# Patient Record
Sex: Female | Born: 1990 | Race: Black or African American | Hispanic: No | Marital: Single | Smoking: Former smoker
Health system: Southern US, Community
[De-identification: ages and names within clinical notes are randomized; demographics above are authoritative.]

## PROBLEM LIST (undated history)

## (undated) DIAGNOSIS — T7840XA Allergy, unspecified, initial encounter: Secondary | ICD-10-CM

## (undated) DIAGNOSIS — Z789 Other specified health status: Secondary | ICD-10-CM

## (undated) HISTORY — DX: Other specified health status: Z78.9

## (undated) HISTORY — DX: Allergy, unspecified, initial encounter: T78.40XA

## (undated) HISTORY — PX: MANDIBLE SURGERY: SHX707

---
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
## 2010-01-12 ENCOUNTER — Emergency Department (HOSPITAL_COMMUNITY): Admission: EM | Admit: 2010-01-12 | Discharge: 2010-01-12 | Payer: Self-pay | Admitting: Emergency Medicine

## 2010-06-04 ENCOUNTER — Emergency Department (HOSPITAL_COMMUNITY)
Admission: EM | Admit: 2010-06-04 | Discharge: 2010-06-04 | Payer: Self-pay | Source: Home / Self Care | Admitting: Emergency Medicine

## 2010-07-13 ENCOUNTER — Encounter: Payer: Self-pay | Admitting: Obstetrics and Gynecology

## 2010-08-13 ENCOUNTER — Other Ambulatory Visit: Payer: Self-pay | Admitting: Family Medicine

## 2010-08-13 DIAGNOSIS — Z3689 Encounter for other specified antenatal screening: Secondary | ICD-10-CM

## 2010-08-13 DIAGNOSIS — O26849 Uterine size-date discrepancy, unspecified trimester: Secondary | ICD-10-CM

## 2010-08-13 LAB — URINALYSIS, ROUTINE W REFLEX MICROSCOPIC
Bilirubin Urine: NEGATIVE
Glucose, UA: NEGATIVE mg/dL
Hgb urine dipstick: NEGATIVE
Ketones, ur: NEGATIVE mg/dL
Protein, ur: NEGATIVE mg/dL
pH: 7 (ref 5.0–8.0)

## 2010-08-14 ENCOUNTER — Ambulatory Visit (HOSPITAL_COMMUNITY)
Admission: RE | Admit: 2010-08-14 | Discharge: 2010-08-14 | Disposition: A | Discharge status: Home / Self Care | Payer: Self-pay | Source: Ambulatory Visit | Attending: Family Medicine | Admitting: Family Medicine

## 2010-08-14 DIAGNOSIS — O3660X0 Maternal care for excessive fetal growth, unspecified trimester, not applicable or unspecified: Secondary | ICD-10-CM | POA: Insufficient documentation

## 2010-08-14 DIAGNOSIS — O358XX0 Maternal care for other (suspected) fetal abnormality and damage, not applicable or unspecified: Secondary | ICD-10-CM | POA: Insufficient documentation

## 2010-08-14 DIAGNOSIS — Z3689 Encounter for other specified antenatal screening: Secondary | ICD-10-CM

## 2010-08-14 DIAGNOSIS — O26849 Uterine size-date discrepancy, unspecified trimester: Secondary | ICD-10-CM

## 2010-08-14 DIAGNOSIS — Z363 Encounter for antenatal screening for malformations: Secondary | ICD-10-CM | POA: Insufficient documentation

## 2010-08-14 DIAGNOSIS — Z1389 Encounter for screening for other disorder: Secondary | ICD-10-CM | POA: Insufficient documentation

## 2010-08-17 LAB — URINALYSIS, ROUTINE W REFLEX MICROSCOPIC
Glucose, UA: NEGATIVE mg/dL
Hgb urine dipstick: NEGATIVE
Specific Gravity, Urine: 1.008 (ref 1.005–1.030)
Urobilinogen, UA: 0.2 mg/dL (ref 0.0–1.0)
pH: 7.5 (ref 5.0–8.0)

## 2010-08-17 LAB — GC/CHLAMYDIA PROBE AMP, GENITAL: GC Probe Amp, Genital: NEGATIVE

## 2010-08-17 LAB — POCT PREGNANCY, URINE: Preg Test, Ur: NEGATIVE

## 2010-08-17 LAB — WET PREP, GENITAL: Clue Cells Wet Prep HPF POC: NONE SEEN

## 2010-10-01 ENCOUNTER — Inpatient Hospital Stay (HOSPITAL_COMMUNITY)
Admission: AD | Admit: 2010-10-01 | Discharge: 2010-10-04 | DRG: 775 | Disposition: A | Discharge status: Home / Self Care | Payer: Medicaid Other | Source: Ambulatory Visit | Attending: Obstetrics & Gynecology | Admitting: Obstetrics & Gynecology

## 2010-10-01 DIAGNOSIS — O411090 Infection of amniotic sac and membranes, unspecified, unspecified trimester, not applicable or unspecified: Secondary | ICD-10-CM | POA: Diagnosis present

## 2010-10-01 LAB — CBC
Hemoglobin: 11.9 g/dL — ABNORMAL LOW (ref 12.0–15.0)
MCH: 31.1 pg (ref 26.0–34.0)
RBC: 3.83 MIL/uL — ABNORMAL LOW (ref 3.87–5.11)
WBC: 12.9 10*3/uL — ABNORMAL HIGH (ref 4.0–10.5)

## 2010-10-02 DIAGNOSIS — O411090 Infection of amniotic sac and membranes, unspecified, unspecified trimester, not applicable or unspecified: Secondary | ICD-10-CM

## 2010-10-02 LAB — ABO/RH: ABO/RH(D): O POS

## 2010-10-02 LAB — RPR: RPR Ser Ql: NONREACTIVE

## 2010-11-05 ENCOUNTER — Encounter: Payer: Self-pay | Admitting: Medical

## 2010-11-05 ENCOUNTER — Ambulatory Visit (INDEPENDENT_AMBULATORY_CARE_PROVIDER_SITE_OTHER): Payer: Medicaid Other | Admitting: Medical

## 2010-11-05 VITALS — BP 102/72 | HR 68 | Ht 59.0 in | Wt 106.0 lb

## 2010-11-05 DIAGNOSIS — L03019 Cellulitis of unspecified finger: Secondary | ICD-10-CM

## 2010-11-05 DIAGNOSIS — IMO0001 Reserved for inherently not codable concepts without codable children: Secondary | ICD-10-CM

## 2010-11-05 MED ORDER — DOXYCYCLINE HYCLATE 100 MG PO TABS: 100.0000 mg | ORAL_TABLET | Freq: Two times a day (BID) | ORAL | Status: AC

## 2010-11-05 NOTE — Progress Notes (Signed)
Subjective:     Catherine Miles is a 20 y.o. female who presents for evaluation of a probable cutaneous infection or right middle finger.  Lesion is located along the nailbed of the right middle finger. Onset was 7 days ago. Symptoms have gradually worsened.  Abscess has associated symptoms of none. Patient does not have previous history of cutaneous abscesses. Patient does not have diabetes.  She does have a new 57mo baby but is not breastfeeding.  No other c/o.   The following portions of the patient's history were reviewed and updated as appropriate: allergies, current medications, past family history, past medical history, past social history, past surgical history and problem list.    Objective:    There is an area characterized by induration, fluctuance, tenderness along the right 3rd finger nailbed.    Procedure Informed consent obtained.  The area was prepped in the usual manner and the skin overlying the abscess was anesthetized with ethyl acetate spray and area was sharply incised with #11 blade and approx 1.5 ccs of purulent material was obtained.   Wound was covered with sterile bandage.      Assessment:   Encounter Diagnosis  Name Primary?  . Paronychia of third finger of right hand Yes     Plan:    Apply hot compresses frequently to promote drainage. Reassured that this represents a benign process. Oral antibiotics -- see med orders. I & D procedure as above. RTC PRN.

## 2010-11-05 NOTE — Patient Instructions (Signed)
Paronychia (Felon, Whitlow) Paronychia is an inflammatory reaction involving the folds of the skin surrounding the fingernail. This is commonly caused by an infection in the skin around a nail. The most common cause of paronychia is frequent wetting of the hands (as seen with bartenders, food servers, nurses or others who wet their hands). This makes the skin around the fingernail susceptible to infection by bacteria (germs) or fungus. Other predisposing factors are:  Aggressive manicuring.   Nail biting.   Thumbsucking.  The most common cause is a staphylococcal (a type of germ) infection, or a fungal (Candida) infection. When caused by a germ, it usually comes on suddenly with redness, swelling, pus and is often painful. It may get under the nail and form an abscess (collection of pus), or form an abscess around the nail. If the nail itself is infected with a fungus, the treatment is usually prolonged and may require oral medicine for up to one year. Your caregiver will determine the length of time treatment is required. The paronychia caused by bacteria (germs) may largely be avoided by not pulling on hangnails or picking at cuticles. When the infection occurs at the tips of the finger it is called felon. When the cause of paronychia is from the herpes simplex virus (HSV) it is called herpetic whitlow. TREATMENT  When an abscess is present treatment is often incision and drainage. This means that the abscess must be cut open so the pus can get out. When this is done, the following home care instructions should be followed.  HOME CARE INSTRUCTIONS  It is important to keep the affected fingers very dry. Rubber or plastic gloves over cotton gloves should be used whenever the hand must be placed in water.   Keep wound clean, dry and dressed as suggested by your caregiver between warm soaks or warm compresses.   Soak in warm water for fifteen to twenty minutes three to four times per day for  bacterial infections. Fungal infections are very difficult to treat, so often require treatment for long periods of time.   For bacterial (germ) infections take antibiotics (medicine which kill germs) as directed and finish the prescription, even if the problem appears to be solved before the medicine is gone.   Only take over-the-counter or prescription medicines for pain, discomfort, or fever as directed by your caregiver.  SEEK IMMEDIATE MEDICAL CARE IF :  There is redness, swelling, or increasing pain in the wound.   Pus is coming from wound.   An unexplained oral temperature above 101 develops.   You notice a foul smell coming from the wound or dressing.  Document Released: 11/13/2000 Document Re-Released: 08/16/2008 Exit[employer] Patient Information 2011 [employer]. 

## 2011-03-27 ENCOUNTER — Encounter: Payer: Self-pay | Admitting: Family Medicine

## 2012-10-24 ENCOUNTER — Emergency Department (HOSPITAL_COMMUNITY)
Admission: EM | Admit: 2012-10-24 | Discharge: 2012-10-24 | Disposition: A | Discharge status: Home / Self Care | Payer: Self-pay | Attending: Emergency Medicine | Admitting: Emergency Medicine

## 2012-10-24 ENCOUNTER — Encounter (HOSPITAL_COMMUNITY): Payer: Self-pay | Admitting: Family Medicine

## 2012-10-24 DIAGNOSIS — L989 Disorder of the skin and subcutaneous tissue, unspecified: Secondary | ICD-10-CM | POA: Insufficient documentation

## 2012-10-24 DIAGNOSIS — M7989 Other specified soft tissue disorders: Secondary | ICD-10-CM

## 2012-10-24 NOTE — ED Provider Notes (Signed)
History     CSN: [id number]  Arrival date & time 10/24/12  0801   First MD Initiated Contact with Patient 10/24/12 [phone]      Chief Complaint  Patient presents with  . Hand Problem    (Consider location/radiation/quality/duration/timing/severity/associated sxs/prior treatment) HPI Comments: Patient reports she was at work yesterday when her boss noticed an area of swelling over the dorsal aspect of her right hand. States she previously was not aware of it.  States after she noticed it and was pressing it it became sore, particularly proximal to the lesion.  Denies fevers, joint pain, weakness or numbness of the hand. Denies any known trauma to the hand. No known insect bites or exposures.    The history is provided by the patient.    Past Medical History  Diagnosis Date  . Allergy     Past Surgical History  Procedure Laterality Date  . Mandible surgery      History reviewed. No pertinent family history.  History  Substance Use Topics  . Smoking status: Never Smoker   . Smokeless tobacco: Never Used  . Alcohol Use: No    OB History   Grav Para Term Preterm Abortions TAB SAB Ect Mult Living                  Review of Systems  Constitutional: Negative for fever and chills.  Skin: Negative for color change and wound.  Neurological: Negative for weakness and numbness.    Allergies  Review of patient's allergies indicates no known allergies.  Home Medications   Current Outpatient Rx  Name  Route  Sig  Dispense  Refill  . medroxyPROGESTERone (DEPO-PROVERA) 150 MG/ML injection   Intramuscular   Inject 150 mg into the muscle every 3 (three) months.             BP 140/98  Pulse 97  Temp(Src) 97.5 F (36.4 C) (Oral)  Resp 20  SpO2 100%  Physical Exam  Nursing note and vitals reviewed. Constitutional: She appears well-developed and well-nourished. No distress.  HENT:  Head: Normocephalic and atraumatic.  Neck: Neck supple.  Pulmonary/Chest: Effort  normal.  Musculoskeletal:       Hands: Right hand/wrist: full AROM of all joints, strength 5/5, sensation intact, capillary refill < 2 seconds.   Neurological: She is alert.  Skin: She is not diaphoretic.    ED Course  Procedures (including critical care time)  Labs Reviewed - No data to display No results found.   1. Soft tissue mass     MDM  Very soft tissue mass over dorsum of right hand.  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  Not infectious appearing.  Discussed findings, likely diagnosis, follow up with patient.  Pt given return precautions.  Pt verbalizes understanding and agrees with plan.     I doubt any other EMC precluding discharge at this time including, but not necessarily limited to the following: abscess         Trixie Dredge, PA-C 10/24/12 [phone]

## 2012-10-24 NOTE — ED Notes (Addendum)
Pt has knot to right hand that is tender to touch.

## 2012-10-25 NOTE — ED Provider Notes (Signed)
Medical screening examination/treatment/procedure(s) were performed by non-physician practitioner and as supervising physician I was immediately available for consultation/collaboration.   Amando Chaput, MD 10/25/12 0708 

## 2013-11-11 ENCOUNTER — Ambulatory Visit (INDEPENDENT_AMBULATORY_CARE_PROVIDER_SITE_OTHER): Payer: Self-pay | Admitting: Family Medicine

## 2013-11-11 ENCOUNTER — Encounter: Payer: Self-pay | Admitting: Family Medicine

## 2013-11-11 VITALS — Wt 102.0 lb

## 2013-11-11 DIAGNOSIS — H109 Unspecified conjunctivitis: Secondary | ICD-10-CM

## 2013-11-11 MED ORDER — ERYTHROMYCIN 5 MG/GM OP OINT: 1.0000 "application " | TOPICAL_OINTMENT | Freq: Three times a day (TID) | OPHTHALMIC | Status: DC

## 2013-11-11 NOTE — Progress Notes (Signed)
   Subjective:    Patient ID: Catherine Miles, female    DOB: 28-Aug-1990, 23 y.o.   MRN: [id number]  HPI She is here for evaluation of a several day history of drainage and itching of both eyes. She's had some slight earache but no fever, chills, sneezing, nasal congestion. She was seen in an urgent care Center when she was just having itchy eyes and told use Benadryl. States that the drainage is green colored.   Review of Systems     Objective:   Physical Exam Both conjunctiva are injected with some slight clear drainage. TMs clear. No preauricular adenopathy noted. Neck is supple without adenopathy. Throat is clear.       Assessment & Plan:  Conjunctivitis - Plan: erythromycin ophthalmic ointment  Reassured her that I did not think she was in any danger. Recommend good handwashing.

## 2015-01-21 ENCOUNTER — Emergency Department (HOSPITAL_COMMUNITY)
Admission: EM | Admit: 2015-01-21 | Discharge: 2015-01-21 | Payer: Medicaid Other | Attending: Emergency Medicine | Admitting: Emergency Medicine

## 2015-01-21 ENCOUNTER — Inpatient Hospital Stay
Admission: RE | Admit: 2015-01-21 | Discharge: 2015-01-24 | DRG: 882 | Disposition: A | Discharge status: Home / Self Care | Payer: No Typology Code available for payment source | Source: Other Acute Inpatient Hospital | Attending: Psychiatry | Admitting: Psychiatry

## 2015-01-21 ENCOUNTER — Encounter: Payer: Self-pay | Admitting: Behavioral Health

## 2015-01-21 ENCOUNTER — Encounter (HOSPITAL_COMMUNITY): Payer: Self-pay | Admitting: Emergency Medicine

## 2015-01-21 DIAGNOSIS — F1290 Cannabis use, unspecified, uncomplicated: Secondary | ICD-10-CM | POA: Diagnosis present

## 2015-01-21 DIAGNOSIS — F4325 Adjustment disorder with mixed disturbance of emotions and conduct: Secondary | ICD-10-CM

## 2015-01-21 DIAGNOSIS — F1010 Alcohol abuse, uncomplicated: Secondary | ICD-10-CM | POA: Diagnosis present

## 2015-01-21 DIAGNOSIS — F17210 Nicotine dependence, cigarettes, uncomplicated: Secondary | ICD-10-CM | POA: Diagnosis present

## 2015-01-21 DIAGNOSIS — F1020 Alcohol dependence, uncomplicated: Secondary | ICD-10-CM

## 2015-01-21 DIAGNOSIS — F39 Unspecified mood [affective] disorder: Secondary | ICD-10-CM | POA: Diagnosis not present

## 2015-01-21 DIAGNOSIS — R45851 Suicidal ideations: Secondary | ICD-10-CM | POA: Diagnosis present

## 2015-01-21 DIAGNOSIS — G4700 Insomnia, unspecified: Secondary | ICD-10-CM | POA: Diagnosis present

## 2015-01-21 DIAGNOSIS — F419 Anxiety disorder, unspecified: Secondary | ICD-10-CM

## 2015-01-21 DIAGNOSIS — F17200 Nicotine dependence, unspecified, uncomplicated: Secondary | ICD-10-CM

## 2015-01-21 DIAGNOSIS — Z3202 Encounter for pregnancy test, result negative: Secondary | ICD-10-CM | POA: Insufficient documentation

## 2015-01-21 DIAGNOSIS — Z792 Long term (current) use of antibiotics: Secondary | ICD-10-CM | POA: Insufficient documentation

## 2015-01-21 DIAGNOSIS — F1220 Cannabis dependence, uncomplicated: Secondary | ICD-10-CM

## 2015-01-21 DIAGNOSIS — F322 Major depressive disorder, single episode, severe without psychotic features: Secondary | ICD-10-CM

## 2015-01-21 LAB — I-STAT CHEM 8, ED
BUN: 12 mg/dL (ref 6–20)
CREATININE: 0.9 mg/dL (ref 0.44–1.00)
Calcium, Ion: 1.11 mmol/L — ABNORMAL LOW (ref 1.12–1.23)
Chloride: 107 mmol/L (ref 101–111)
Glucose, Bld: 92 mg/dL (ref 65–99)
HEMATOCRIT: 43 % (ref 36.0–46.0)
HEMOGLOBIN: 14.6 g/dL (ref 12.0–15.0)
POTASSIUM: 3.4 mmol/L — AB (ref 3.5–5.1)
SODIUM: 142 mmol/L (ref 135–145)
TCO2: 20 mmol/L (ref 0–100)

## 2015-01-21 LAB — CBC WITH DIFFERENTIAL/PLATELET
BASOS ABS: 0 10*3/uL (ref 0.0–0.1)
BASOS PCT: 0 % (ref 0–1)
EOS ABS: 0.1 10*3/uL (ref 0.0–0.7)
EOS PCT: 1 % (ref 0–5)
HCT: 38 % (ref 36.0–46.0)
Hemoglobin: 13.2 g/dL (ref 12.0–15.0)
Lymphocytes Relative: 26 % (ref 12–46)
Lymphs Abs: 2.1 10*3/uL (ref 0.7–4.0)
MCH: 32.7 pg (ref 26.0–34.0)
MCHC: 34.7 g/dL (ref 30.0–36.0)
MCV: 94.1 fL (ref 78.0–100.0)
MONO ABS: 0.6 10*3/uL (ref 0.1–1.0)
MONOS PCT: 8 % (ref 3–12)
NEUTROS ABS: 5.3 10*3/uL (ref 1.7–7.7)
Neutrophils Relative %: 65 % (ref 43–77)
PLATELETS: 213 10*3/uL (ref 150–400)
RBC: 4.04 MIL/uL (ref 3.87–5.11)
RDW: 14.4 % (ref 11.5–15.5)
WBC: 8.1 10*3/uL (ref 4.0–10.5)

## 2015-01-21 LAB — RAPID URINE DRUG SCREEN, HOSP PERFORMED
AMPHETAMINES: NOT DETECTED
BENZODIAZEPINES: NOT DETECTED
Barbiturates: NOT DETECTED
COCAINE: NOT DETECTED
OPIATES: NOT DETECTED
Tetrahydrocannabinol: NOT DETECTED

## 2015-01-21 LAB — ETHANOL: Alcohol, Ethyl (B): 236 mg/dL — ABNORMAL HIGH (ref <5)

## 2015-01-21 LAB — I-STAT BETA HCG BLOOD, ED (MC, WL, AP ONLY)

## 2015-01-21 MED ORDER — POTASSIUM CHLORIDE CRYS ER 20 MEQ PO TBCR
40.0000 meq | EXTENDED_RELEASE_TABLET | Freq: Once | ORAL | Status: AC
  Administered 2015-01-21: 40 meq via ORAL
  Filled 2015-01-21: qty 2

## 2015-01-21 MED ORDER — ONDANSETRON HCL 4 MG PO TABS: 4.0000 mg | ORAL_TABLET | Freq: Three times a day (TID) | ORAL | Status: DC | PRN

## 2015-01-21 MED ORDER — NICOTINE 10 MG IN INHA
1.0000 | RESPIRATORY_TRACT | Status: DC | PRN
  Filled 2015-01-21: qty 36

## 2015-01-21 MED ORDER — NICOTINE 21 MG/24HR TD PT24
21.0000 mg | MEDICATED_PATCH | Freq: Every day | TRANSDERMAL | Status: DC
  Filled 2015-01-21: qty 1

## 2015-01-21 NOTE — ED Notes (Signed)
Awake. Watching TV and interacting with staff. Verbally responsive. Resp even and unlabored. ABC's intact. No behavior problems noted. NAD noted. Sitter at bedside.
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
## 2015-01-21 NOTE — ED Notes (Signed)
Awake. Verbally responsive. Resp even and unlabored. ABC's intact. No behavior problems noted. NAD noted. Sitter at bedside. Family at bedside.

## 2015-01-21 NOTE — ED Notes (Signed)
Awake. Verbally responsive. Resp even and unlabored. ABC's intact. No behavior problems noted. Pt ambulated to BR with steady gait. NAD noted. Sitter at bedside.  

## 2015-01-21 NOTE — ED Notes (Signed)
Awake. Watching TV and interacting with staff. Verbally responsive. Resp even and unlabored. ABC's intact. No behavior problems noted. Pt ambulated to BR with steady gait. NAD noted. Sitter at bedside.

## 2015-01-21 NOTE — ED Notes (Signed)
Pt states she "wants to leave." She states her suicide ideation/attempt that lead her to admission here was alcohol induced. She adds that she is worried that "her baby will not have a Arts administrator, and she might loose her job." I have notified EDP and my charge nurse, suicide/safety will continue with a heightened alert with this new concern.

## 2015-01-21 NOTE — ED Notes (Signed)
Awake and watching TV. Interacting with staff. Verbally responsive. Resp even and unlabored. ABC's intact. No behavior problems noted. NAD noted. Sitter at bedside.

## 2015-01-21 NOTE — ED Notes (Signed)
Awake. Verbally responsive. Resp even and unlabored. ABC's intact. No behavior problems noted. NAD noted. Sitter at bedside. Family at bedside. 

## 2015-01-21 NOTE — Progress Notes (Signed)
Disposition CSW completed patient referrals to the following inpatient psych facilities:  [facility]  CSW will continue to assist as needed with placement.  [facility] Disposition CSW [phone]

## 2015-01-21 NOTE — ED Notes (Signed)
Placed 1 shirt, 1 pair of blue jeans, 1 pair of earrings, and 1 necklace into pt belonging bag with the nurse behind the nurses station.  The belongings bag also has the patient's ID sticker on it.
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
## 2015-01-21 NOTE — ED Notes (Signed)
Awake. Watching TV. Verbally responsive. Resp even and unlabored. ABC's intact. No behavior problems noted. NAD noted. Sitter at bedside. 
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
## 2015-01-21 NOTE — BH Assessment (Addendum)
Tele Assessment Note  Presenting to [facility] after a suicide attempt. Pt stated "I tried to kill myself by taking pills". "I took a handful of pills this morning and another handful this afternoon". "I also had a bottle of Patron". Pt reported that she has been experiencing thoughts to harm herself for the past 2 days. Pt did not report any previous suicide attempts or self-injurious behaviors. Pt also shared that tonight her first anxiety attack. Pt denies HI and AVH at this time. Pt repotted that she drinks alcohol daily and stated "I have a drinking problem". Pt denies any illicit substance use. Pt did not report any previous mental health or substance abuse treatment. Pt denies any physical, emotional or sexual abuse. Inpatient treatment is recommended Yaretzy Hassey is an 24 y.o. female  Axis I: Anxiety Disorder NOS, Depressive Disorder NOS and Alcohol intoxication, with moderate or severe use disorder.   Past Medical History:  Past Medical History  Diagnosis Date  . Allergy     Past Surgical History  Procedure Laterality Date  . Mandible surgery      Family History: History reviewed. No pertinent family history.  Social History:  reports that she has never smoked. She has never used smokeless tobacco. She reports that she drinks alcohol. She reports that she does not use illicit drugs.  Additional Social History:  Alcohol / Drug Use Pain Medications: Pt denies abuse.  Prescriptions: Pt denies abuse.  Over the Counter: Pt denies abuse.  History of alcohol / drug use?: Yes Substance #1 Name of Substance 1: Alcohol  1 - Age of First Use: 22 1 - Amount (size/oz): "2-3 full bottles "  1 - Frequency: daily  1 - Duration: ongoing  1 - Last Use / Amount: 01-20-15  CIWA: CIWA-Ar BP: 101/60 mmHg COWS:    PATIENT STRENGTHS: (choose at least two) Average or above average intelligence Motivation for treatment/growth  Allergies: No Known Allergies  Home Medications:  (Not in a  hospital admission)  OB/GYN Status:  No LMP recorded. Patient has had an injection.  General Assessment Data Location of Assessment: [facility] TTS Assessment: In system Is this a Tele or Face-to-Face Assessment?: Face-to-Face Is this an Initial Assessment or a Re-assessment for this encounter?: Initial Assessment Marital status: Single Living Arrangements: Alone Can pt return to current living arrangement?: Yes Admission Status: Voluntary Is patient capable of signing voluntary admission?: Yes Referral Source: Self/Family/Friend Insurance type: Medicaid      Crisis Care Plan Living Arrangements: Alone Name of Psychiatrist: No provider reported at this time.  Name of Therapist: No provider reported at this time.   Education Status Is patient currently in school?: No Current Grade: N/A Highest grade of school patient has completed: 92 Name of school: N/A Contact person: N/A  Risk to self with the past 6 months Suicidal Ideation: Yes-Currently Present Has patient been a risk to self within the past 6 months prior to admission? : Yes Suicidal Intent: Yes-Currently Present Has patient had any suicidal intent within the past 6 months prior to admission? : Yes Is patient at risk for suicide?: Yes Suicidal Plan?: Yes-Currently Present Has patient had any suicidal plan within the past 6 months prior to admission? : No Specify Current Suicidal Plan: Pt reported that she took an unknown amount of unknown pills today in an attempt to end her life.  Access to Means: Yes Specify Access to Suicidal Means: Pt had access to pills.  What has been your use of drugs/alcohol  within the last 12 months?: Pt reported daily alcohol use.  Previous Attempts/Gestures: No How many times?: 0 Other Self Harm Risks: No other self harm risk identified at this time.  Triggers for Past Attempts: None known Intentional Self Injurious Behavior: None Family Suicide History: No Recent stressful life event(s):  Job Loss, Financial Problems Persecutory voices/beliefs?: No Depression: Yes Depression Symptoms: Despondent, Feeling worthless/self pity Substance abuse history and/or treatment for substance abuse?: Yes Suicide prevention information given to non-admitted patients: Not applicable  Risk to Others within the past 6 months Homicidal Ideation: No Does patient have any lifetime risk of violence toward others beyond the six months prior to admission? : No Thoughts of Harm to Others: No Current Homicidal Intent: No Current Homicidal Plan: No Access to Homicidal Means: No Identified Victim: N/A History of harm to others?: No Assessment of Violence: On admission Violent Behavior Description: No violent behaviors observed. Pt is calm and cooperative at this time.  Does patient have access to weapons?: No Criminal Charges Pending?: No Does patient have a court date: No Is patient on probation?: No  Psychosis Hallucinations: None noted Delusions: None noted  Mental Status Report Appearance/Hygiene: Other (Comment) (Dressed appropriately ) Eye Contact: Fair Motor Activity: Unable to assess Speech: Logical/coherent Level of Consciousness: Quiet/awake, Drowsy Mood: Pleasant Affect: Appropriate to circumstance Anxiety Level: Minimal Thought Processes: Coherent, Relevant Judgement: Impaired Orientation: Person, Place, Time, Situation Obsessive Compulsive Thoughts/Behaviors: None  Cognitive Functioning Concentration: Normal Memory: Recent Intact, Remote Intact IQ: Average Insight: Poor Impulse Control: Poor Appetite: Poor Weight Loss: 0 Weight Gain: 0 Sleep: No Change Total Hours of Sleep: 8 Vegetative Symptoms: None  ADLScreening [facility] Services) Patient's cognitive ability adequate to safely complete daily activities?: Yes Patient able to express need for assistance with ADLs?: Yes Independently performs ADLs?: Yes (appropriate for developmental age)  Prior  Inpatient Therapy Prior Inpatient Therapy: No  Prior Outpatient Therapy Prior Outpatient Therapy: No Does patient have an ACCT team?: No Does patient have Intensive In-House Services?  : No Does patient have [facility] services? : No Does patient have P4CC services?: No  ADL Screening (condition at time of admission) Patient's cognitive ability adequate to safely complete daily activities?: Yes Is the patient deaf or have difficulty hearing?: No Does the patient have difficulty seeing, even when wearing glasses/contacts?: No Does the patient have difficulty concentrating, remembering, or making decisions?: No Patient able to express need for assistance with ADLs?: Yes Does the patient have difficulty dressing or bathing?: No Independently performs ADLs?: Yes (appropriate for developmental age)       Abuse/Neglect Assessment (Assessment to be complete while patient is alone) Physical Abuse: Denies Verbal Abuse: Denies Sexual Abuse: Denies Exploitation of patient/patient's resources: Denies Self-Neglect: Denies     Merchant navy officer (For Healthcare) Does patient have an advance directive?: No    Additional Information 1:1 In Past 12 Months?: No CIRT Risk: No Elopement Risk: No Does patient have medical clearance?: No (Labs pending )     Disposition:  Disposition Initial Assessment Completed for this Encounter: Yes Disposition of Patient: Inpatient treatment program Type of inpatient treatment program: Adult  Tobechukwu Emmick S 01/21/2015 1:49 AM

## 2015-01-21 NOTE — ED Notes (Signed)
Called and spoken to [facility], intake and reported to send pt via [facility] and then spoken to [employer] and he reported that facility unable to accept pt at this time and will have night nurses to give call as to when to transport pt. Pt made aware of delay in transportation. Verbalized understanding.

## 2015-01-21 NOTE — Progress Notes (Signed)
Per Minerva Areola, charge nurse with [facility] patient is accepted to bed 306 and the attending will be Dr. Ardyth Harps.  Call report to [phone]. Patient can be transferred at this time for admission.  This writer informed Julieanne Cotton, NP and Marylee Floras, RN of the updated disposition.      Maryelizabeth Rowan, MSW, [facility] Triage Specialist [phone]

## 2015-01-21 NOTE — BH Assessment (Signed)
Assessment completed. Consulted Hulan Fess, NP who recommended inpatient treatment. Pt will be referred for inpatient treatment.

## 2015-01-22 DIAGNOSIS — F39 Unspecified mood [affective] disorder: Secondary | ICD-10-CM

## 2015-01-22 NOTE — [facility] Group Notes (Signed)
[facility] LCSW Group Therapy  01/22/2015 2:27 PM  Type of Therapy:  Group Therapy  Participation Level:  Minimal  Participation Quality:  Attentive  Affect:  Appropriate  Cognitive:  Appropriate  Insight:  Limited  Engagement in Therapy:  Improving  Modes of Intervention:  Discussion, Education, Socialization and Support  Summary of Progress/Problems: Todays topic: Grudges  Patients will be encouraged to discuss their thoughts, feelings, and behaviors as to why one holds on to grudges and reasons why people have grudges. Patients will process the impact of grudges on their daily lives and identify thoughts and feelings related to holding grudges. Patients will identify feelings and thoughts related to what life would look like without grudges. Emmersen attended group and stayed the entire time. She sat quietly and listened to other group members.   [employer] MSW, LCSWA  01/22/2015, 2:27 PM

## 2015-01-22 NOTE — H&P (Signed)
Psychiatric Admission Assessment Adult  Patient Identification: Catherine Miles MRN:  [id number] Date of Evaluation:  01/22/2015 Chief Complaint:  depression Principal Diagnosis: <principal problem not specified> Diagnosis:   Patient Active Problem List   Diagnosis Date Noted  . Mood disorder [F39] 01/21/2015   History of Present Illness:: 24 year old woman referred on IVC from [facility]. Her chief complaint "I had a lot to drink". Patient evidently consumed a lot of alcohol possibly a whole bottle of tequila on Friday. She got very frustrated and made some suicidal statements. It says in the referral that she had taken pills. Patient says she only took an appropriate amount of medicine for a headache but didn't do anything to try and hurt her self. She denies having driven her car while intoxicated but says she was having suicidal thoughts. She was frustrated because her life is too stressful. 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 She lost her temper about it. She is not getting any outpatient mental health treatment at all. She says that she usually binge drinks on the weekend but before hadn't thought of it is a major problem. Hasn't been using any other drugs recently although she used to use marijuana. Patient has been depressed and anxious. Intermittently impaired sleep and appetite. No psychotic symptoms.  Past psychiatric history: Got some therapy in middle school but has not had any psychiatric treatment since then. No prescriptions for psychiatric medicine or prior suicide attempts.  Social history: Has a 80-year-old daughter. She has her own place to live but has several people friends and relatives who are freeloading off of her. Works 2 jobs. Feels very stressed.  Medical history: No significant medical problems  Family history: Doesn't know of any family history of mental health problems  Substance abuse history: No history of substance  abuse treatment no history of DTs or seizures. Elements:  Quality:  Suicidal statements while intoxicated. Severity:  Severe but transient. Timing:  Happened yesterday or Friday. Duration:  Now resolved. Context:  Alcohol abuse. Associated Signs/Symptoms: Depression Symptoms:  depressed mood, fatigue, suicidal thoughts without plan, (Hypo) Manic Symptoms:  None Anxiety Symptoms:  Excessive Worry, Psychotic Symptoms:  None PTSD Symptoms: Avoidance:  None Total Time spent with patient: 1 hour  Past Medical History:  Past Medical History  Diagnosis Date  . Allergy     Past Surgical History  Procedure Laterality Date  . Mandible surgery     Family History: History reviewed. No pertinent family history. Social History:  History  Alcohol Use  . 7.2 oz/week  . 12 Shots of liquor per week    Comment: on weekends 6 or more drinks     History  Drug Use No    Social History   Social History  . Marital Status: Single    Spouse Name: N/A  . Number of Children: N/A  . Years of Education: N/A   Social History Main Topics  . Smoking status: Current Every Day Smoker -- 0.50 packs/day    Types: Cigarettes  . Smokeless tobacco: Never Used  . Alcohol Use: 7.2 oz/week    12 Shots of liquor per week     Comment: on weekends 6 or more drinks  . Drug Use: No  . Sexual Activity: Yes    Birth Control/ Protection: None   Other Topics Concern  . None   Social History Narrative   Additional Social History:    History of alcohol / drug use?:  Yes Longest period of sobriety (when/how long): unknown Negative Consequences of Use: Work / Programmer, multimedia, Personal relationships                     Musculoskeletal: Strength & Muscle Tone: within normal limits Gait & Station: normal Patient leans: N/A  Psychiatric Specialty Exam: Physical Exam  Nursing note and vitals reviewed. Constitutional: She appears well-developed and well-nourished.  HENT:  Head: Normocephalic and  atraumatic.  Eyes: Conjunctivae are normal. Pupils are equal, round, and reactive to light.  Neck: Normal range of motion.  Cardiovascular: Normal heart sounds.   Respiratory: Effort normal.  GI: Soft.  Musculoskeletal: Normal range of motion.  Neurological: She is alert.  Skin: Skin is warm and dry.  Psychiatric: She has a normal mood and affect. Her speech is normal and behavior is normal. Judgment and thought content normal. Cognition and memory are normal.    Review of Systems  Constitutional: Negative.   HENT: Negative.   Eyes: Negative.   Respiratory: Negative.   Cardiovascular: Negative.   Gastrointestinal: Negative.   Musculoskeletal: Negative.   Skin: Negative.   Neurological: Negative.   Psychiatric/Behavioral: Positive for substance abuse. Negative for depression, suicidal ideas and hallucinations. The patient is not nervous/anxious.     Blood pressure 99/66, pulse 54, temperature 97.9 F (36.6 C), temperature source Oral, resp. rate 20, height  (1.499 m), weight 46.267 kg (102 lb), last menstrual period 01/16/2015.Body mass index is 20.59 kg/(m^2).  General Appearance: Casual  Eye Contact::  Good  Speech:  Clear and Coherent  Volume:  Normal  Mood:  Euthymic  Affect:  Full Range  Thought Process:  Logical  Orientation:  Full (Time, Place, and Person)  Thought Content:  Negative  Suicidal Thoughts:  No  Homicidal Thoughts:  No  Memory:  Immediate;   Good Recent;   Good Remote;   Good  Judgement:  Fair  Insight:  Fair  Psychomotor Activity:  Normal  Concentration:  Good  Recall:  Good  Fund of Knowledge:Good  Language: Good  Akathisia:  No  Handed:  Right  AIMS (if indicated):     Assets:  Communication Skills Desire for Improvement Financial Resources/Insurance Housing Physical Health Resilience  ADL's:  Intact  Cognition: WNL  Sleep:  Number of Hours: 7   Risk to Self: Is patient at risk for suicide?: No What has been your use of  drugs/alcohol within the last 12 months?: Pt reports binge drinking twice a month  Risk to Others:   Prior Inpatient Therapy:   Prior Outpatient Therapy:    Alcohol Screening: 1. How often do you have a drink containing alcohol?: 2 to 3 times a week 2. How many drinks containing alcohol do you have on a typical day when you are drinking?: 5 or 6 3. How often do you have six or more drinks on one occasion?: Monthly Preliminary Score: 4 4. How often during the last year have you found that you were not able to stop drinking once you had started?: Monthly 5. How often during the last year have you failed to do what was normally expected from you becasue of drinking?: Never 6. How often during the last year have you needed a first drink in the morning to get yourself going after a heavy drinking session?: Never 7. How often during the last year have you had a feeling of guilt of remorse after drinking?: Weekly 8. How often during the last year have you been  unable to remember what happened the night before because you had been drinking?: Never 9. Have you or someone else been injured as a result of your drinking?: No 10. Has a relative or friend or a doctor or another health worker been concerned about your drinking or suggested you cut down?: No Alcohol Use Disorder Identification Test Final Score (AUDIT): 12 Brief Intervention: Yes  Allergies:  No Known Allergies Lab Results:  Results for orders placed or performed during the hospital encounter of 01/21/15 (from the past 48 hour(s))  CBC with Differential     Status: None   Collection Time: 01/21/15  1:05 AM  Result Value Ref Range   WBC 8.1 4.0 - 10.5 K/uL   RBC 4.04 3.87 - 5.11 MIL/uL   Hemoglobin 13.2 12.0 - 15.0 g/dL   HCT 16.1 09.6 - 04.5 %   MCV 94.1 78.0 - 100.0 fL   MCH 32.7 26.0 - 34.0 pg   MCHC 34.7 30.0 - 36.0 g/dL   RDW 40.9 81.1 - 91.4 %   Platelets 213 150 - 400 K/uL   Neutrophils Relative % 65 43 - 77 %   Neutro Abs  5.3 1.7 - 7.7 K/uL   Lymphocytes Relative 26 12 - 46 %   Lymphs Abs 2.1 0.7 - 4.0 K/uL   Monocytes Relative 8 3 - 12 %   Monocytes Absolute 0.6 0.1 - 1.0 K/uL   Eosinophils Relative 1 0 - 5 %   Eosinophils Absolute 0.1 0.0 - 0.7 K/uL   Basophils Relative 0 0 - 1 %   Basophils Absolute 0.0 0.0 - 0.1 K/uL  Ethanol     Status: Abnormal   Collection Time: 01/21/15  1:05 AM  Result Value Ref Range   Alcohol, Ethyl (B) 236 (H) <5 mg/dL    Comment:        LOWEST DETECTABLE LIMIT FOR SERUM ALCOHOL IS 5 mg/dL FOR MEDICAL PURPOSES ONLY   I-Stat Beta hCG blood, ED ([facility], [facility], [facility] only)     Status: None   Collection Time: 01/21/15  1:09 AM  Result Value Ref Range   I-stat hCG, quantitative <5.0 <5 mIU/mL   Comment 3            Comment:   GEST. AGE      CONC.  (mIU/mL)   <=1 WEEK        5 - 50     2 WEEKS       50 - 500     3 WEEKS       100 - 10,000     4 WEEKS     1,000 - 30,000        FEMALE AND NON-PREGNANT FEMALE:     LESS THAN 5 mIU/mL   I-stat chem 8, ed     Status: Abnormal   Collection Time: 01/21/15  1:12 AM  Result Value Ref Range   Sodium 142 135 - 145 mmol/L   Potassium 3.4 (L) 3.5 - 5.1 mmol/L   Chloride 107 101 - 111 mmol/L   BUN 12 6 - 20 mg/dL   Creatinine, Ser 7.82 0.44 - 1.00 mg/dL   Glucose, Bld 92 65 - 99 mg/dL   Calcium, Ion 9.56 (L) 1.12 - 1.23 mmol/L   TCO2 20 0 - 100 mmol/L   Hemoglobin 14.6 12.0 - 15.0 g/dL   HCT 21.3 08.6 - 57.8 %  Urine rapid drug screen (hosp performed)     Status: None   Collection  Time: 01/21/15  3:59 AM  Result Value Ref Range   Opiates NONE DETECTED NONE DETECTED   Cocaine NONE DETECTED NONE DETECTED   Benzodiazepines NONE DETECTED NONE DETECTED   Amphetamines NONE DETECTED NONE DETECTED   Tetrahydrocannabinol NONE DETECTED NONE DETECTED   Barbiturates NONE DETECTED NONE DETECTED    Comment:        DRUG SCREEN FOR MEDICAL PURPOSES ONLY.  IF CONFIRMATION IS NEEDED FOR ANY PURPOSE, NOTIFY LAB WITHIN 5 DAYS.        LOWEST  DETECTABLE LIMITS FOR URINE DRUG SCREEN Drug Class       Cutoff (ng/mL) Amphetamine      1000 Barbiturate      200 Benzodiazepine   200 Tricyclics       300 Opiates          300 Cocaine          300 THC              50    Current Medications: Current Facility-Administered Medications  Medication Dose Route Frequency Provider Last Rate Last Dose  . nicotine (NICOTROL) 10 MG inhaler 1 continuous puffing  1 continuous puffing Inhalation PRN Jimmy Footman, MD       PTA Medications: No prescriptions prior to admission    Previous Psychotropic Medications: No   Substance Abuse History in the last 12 months:  Yes.      Consequences of Substance Abuse: Legal Consequences:  Hospitalization  Results for orders placed or performed during the hospital encounter of 01/21/15 (from the past 72 hour(s))  CBC with Differential     Status: None   Collection Time: 01/21/15  1:05 AM  Result Value Ref Range   WBC 8.1 4.0 - 10.5 K/uL   RBC 4.04 3.87 - 5.11 MIL/uL   Hemoglobin 13.2 12.0 - 15.0 g/dL   HCT 16.1 09.6 - 04.5 %   MCV 94.1 78.0 - 100.0 fL   MCH 32.7 26.0 - 34.0 pg   MCHC 34.7 30.0 - 36.0 g/dL   RDW 40.9 81.1 - 91.4 %   Platelets 213 150 - 400 K/uL   Neutrophils Relative % 65 43 - 77 %   Neutro Abs 5.3 1.7 - 7.7 K/uL   Lymphocytes Relative 26 12 - 46 %   Lymphs Abs 2.1 0.7 - 4.0 K/uL   Monocytes Relative 8 3 - 12 %   Monocytes Absolute 0.6 0.1 - 1.0 K/uL   Eosinophils Relative 1 0 - 5 %   Eosinophils Absolute 0.1 0.0 - 0.7 K/uL   Basophils Relative 0 0 - 1 %   Basophils Absolute 0.0 0.0 - 0.1 K/uL  Ethanol     Status: Abnormal   Collection Time: 01/21/15  1:05 AM  Result Value Ref Range   Alcohol, Ethyl (B) 236 (H) <5 mg/dL    Comment:        LOWEST DETECTABLE LIMIT FOR SERUM ALCOHOL IS 5 mg/dL FOR MEDICAL PURPOSES ONLY   I-Stat Beta hCG blood, ED ([facility], [facility], [facility] only)     Status: None   Collection Time: 01/21/15  1:09 AM  Result Value Ref Range   I-stat  hCG, quantitative <5.0 <5 mIU/mL   Comment 3            Comment:   GEST. AGE      CONC.  (mIU/mL)   <=1 WEEK        5 - 50     2 WEEKS  50 - 500     3 WEEKS       100 - 10,000     4 WEEKS     1,000 - 30,000        FEMALE AND NON-PREGNANT FEMALE:     LESS THAN 5 mIU/mL   I-stat chem 8, ed     Status: Abnormal   Collection Time: 01/21/15  1:12 AM  Result Value Ref Range   Sodium 142 135 - 145 mmol/L   Potassium 3.4 (L) 3.5 - 5.1 mmol/L   Chloride 107 101 - 111 mmol/L   BUN 12 6 - 20 mg/dL   Creatinine, Ser 1.19 0.44 - 1.00 mg/dL   Glucose, Bld 92 65 - 99 mg/dL   Calcium, Ion 1.47 (L) 1.12 - 1.23 mmol/L   TCO2 20 0 - 100 mmol/L   Hemoglobin 14.6 12.0 - 15.0 g/dL   HCT 82.9 56.2 - 13.0 %  Urine rapid drug screen (hosp performed)     Status: None   Collection Time: 01/21/15  3:59 AM  Result Value Ref Range   Opiates NONE DETECTED NONE DETECTED   Cocaine NONE DETECTED NONE DETECTED   Benzodiazepines NONE DETECTED NONE DETECTED   Amphetamines NONE DETECTED NONE DETECTED   Tetrahydrocannabinol NONE DETECTED NONE DETECTED   Barbiturates NONE DETECTED NONE DETECTED    Comment:        DRUG SCREEN FOR MEDICAL PURPOSES ONLY.  IF CONFIRMATION IS NEEDED FOR ANY PURPOSE, NOTIFY LAB WITHIN 5 DAYS.        LOWEST DETECTABLE LIMITS FOR URINE DRUG SCREEN Drug Class       Cutoff (ng/mL) Amphetamine      1000 Barbiturate      200 Benzodiazepine   200 Tricyclics       300 Opiates          300 Cocaine          300 THC              50     Observation Level/Precautions:  15 minute checks  Laboratory:  CBC Chemistry Profile UDS UA  Psychotherapy:    Medications:    Consultations:    Discharge Concerns:    Estimated LOS:  Other:     Psychological Evaluations: No   Treatment Plan Summary: Daily contact with patient to assess and evaluate symptoms and progress in treatment, Medication management and Plan Patient currently not expressing depressive symptoms not clear that she  needs any psychiatric medicine. Defer decision to primary team. Supportive counseling and review of risks of alcohol abuse. Engage in daily group and individual therapy.  Medical Decision Making:  New problem, with additional work up planned, Review of Psycho-Social Stressors (1), Review or order clinical lab tests (1) and Review or order medicine tests (1)  I certify that inpatient services furnished can reasonably be expected to improve the patient's condition.   Deina Lipsey 8/21/20163:17 PM

## 2015-01-22 NOTE — Progress Notes (Signed)
This writer completed [id number]  Catherine Miles, MSW, Clare Charon [facility] Triage Specialist [phone]

## 2015-01-22 NOTE — [facility] Suicide Risk Assessment (Signed)
[facility] Admission Suicide Risk Assessment   Nursing information obtained from:  Patient Demographic factors:  Low socioeconomic status, Unemployed Current Mental Status:  NA Loss Factors:  Financial problems / change in socioeconomic status, Decrease in vocational status Historical Factors:  NA Risk Reduction Factors:  Responsible for children under 25 years of age, Sense of responsibility to family, Living with another person, especially a relative Total Time spent with patient: 1 hour Principal Problem: <principal problem not specified> Diagnosis:   Patient Active Problem List   Diagnosis Date Noted  . Mood disorder [F39] 01/21/2015     Continued Clinical Symptoms:  Alcohol Use Disorder Identification Test Final Score (AUDIT): 12 The "Alcohol Use Disorders Identification Test", Guidelines for Use in Primary Care, Second Edition.  World Science writer [facility]). Score between 0-7:  no or low risk or alcohol related problems. Score between 8-15:  moderate risk of alcohol related problems. Score between 16-19:  high risk of alcohol related problems. Score 20 or above:  warrants further diagnostic evaluation for alcohol dependence and treatment.   CLINICAL FACTORS:   Depression:   Hopelessness Alcohol/Substance Abuse/Dependencies   Musculoskeletal: Strength & Muscle Tone: within normal limits Gait & Station: normal Patient leans: N/A  Psychiatric Specialty Exam: Physical Exam  ROS  Blood pressure 99/66, pulse 54, temperature 97.9 F (36.6 C), temperature source Oral, resp. rate 20, height  (1.499 m), weight 46.267 kg (102 lb), last menstrual period 01/16/2015.Body mass index is 20.59 kg/(m^2).  General Appearance: Casual  Eye Contact::  Good  Speech:  Clear and Coherent  Volume:  Normal  Mood:  Euthymic  Affect:  Congruent  Thought Process:  Coherent  Orientation:  Full (Time, Place, and Person)  Thought Content:  Negative  Suicidal Thoughts:  No  Homicidal  Thoughts:  No  Memory:  Immediate;   Good Recent;   Good Remote;   Good  Judgement:  Fair  Insight:  Fair  Psychomotor Activity:  Normal  Concentration:  Fair  Recall:  Fair  Fund of Knowledge:Fair  Language: Fair  Akathisia:  No  Handed:  Right  AIMS (if indicated):     Assets:  Communication Skills Desire for Improvement Financial Resources/Insurance Housing Social Support  Sleep:  Number of Hours: 7  Cognition: WNL  ADL's:  Intact     COGNITIVE FEATURES THAT CONTRIBUTE TO RISK:  None    SUICIDE RISK:   Mild:  Suicidal ideation of limited frequency, intensity, duration, and specificity.  There are no identifiable plans, no associated intent, mild dysphoria and related symptoms, good self-control (both objective and subjective assessment), few other risk factors, and identifiable protective factors, including available and accessible social support.  PLAN OF CARE: Patient is admitted to psychiatry and is on 15 minute checks. Not clear that we need any psychiatric medicine at this time. Daily individual and group psychotherapy and reevaluation for appropriate discharge planning.  Medical Decision Making:  New problem, with additional work up planned, Review of Psycho-Social Stressors (1) and Review of Medication Regimen & Side Effects (2)  I certify that inpatient services furnished can reasonably be expected to improve the patient's condition.   Aylee Littrell 01/22/2015, 3:32 PM

## 2015-01-22 NOTE — Progress Notes (Signed)
Pt visible on the unit. Pleasant in good spirits. Denies SI. Attended group. Voices no compliants.

## 2015-01-22 NOTE — Progress Notes (Signed)
Patient states she is feeling better and that coming here has helped her realize how overwhelmed she was. States she is going to continue working on her recovery after discharge and hopes to leave tomorrow. She denies SI/HI/AVH. Affect is constricted. Appetite poor. No acute distress. Will continue to monitor.
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
## 2015-01-22 NOTE — Tx Team (Signed)
Initial Interdisciplinary Treatment Plan   PATIENT STRESSORS: Financial difficulties Occupational concerns Substance abuse   PATIENT STRENGTHS: Ability for insight Active sense of humor Average or above average intelligence Capable of independent living Communication skills General fund of knowledge Motivation for treatment/growth Physical Health Special hobby/interest Supportive family/friends Work skills   PROBLEM LIST: Problem List/Patient Goals Date to be addressed Date deferred Reason deferred Estimated date of resolution  Substance Abuse 01/22/2015   01/29/2015  Depression 01/22/2015   01/29/2015  Suicidality 01/22/2015   01/29/2015  Unemployment 01/22/2015   01/29/2015                                 DISCHARGE CRITERIA:  Improved stabilization in mood, thinking, and/or behavior Motivation to continue treatment in a less acute level of care Need for constant or close observation no longer present Verbal commitment to aftercare and medication compliance Withdrawal symptoms are absent or subacute and managed without 24-hour nursing intervention  PRELIMINARY DISCHARGE PLAN: Attend 12-step recovery group Outpatient therapy Return to previous living arrangement  PATIENT/FAMIILY INVOLVEMENT: This treatment plan has been presented to and reviewed with the patient, Catherine Miles, and/or family member.  The patient and family have been given the opportunity to ask questions and make suggestions.  Andrey Hoobler Shari Prows 01/22/2015, 12:09 AM

## 2015-01-22 NOTE — Plan of Care (Signed)
Problem: Ineffective individual coping Goal: STG: Patient will remain free from self harm Outcome: Progressing Denies SI.     

## 2015-01-22 NOTE — [facility] Counselor (Signed)
Adult Comprehensive Assessment  Patient ID: Catherine Miles, female   DOB: Jan 25, 1991, 24 y.o.   MRN: [id number]  Information Source: Information source: Patient  Current Stressors:  Educational / Learning stressors: None reported  Employment / Job issues: Pt reports stress at job due to working long hours everyday  Family Relationships: None reported  Surveyor, quantity / Lack of resources (include bankruptcy): Limited income  Housing / Lack of housing: None reported  Physical health (include injuries & life threatening diseases): None reported  Social relationships: None reported  Substance abuse: Pt reports binge drinking twice per month.  Bereavement / Loss: None reported.   Living/Environment/Situation:  Living Arrangements: Children Living conditions (as described by patient or guardian): Good  How long has patient lived in current situation?: 1.5 years  What is atmosphere in current home: Supportive, Comfortable, Loving  Family History:  Marital status: Single Does patient have children?: Yes How many children?: 1 How is patient's relationship with their children?: 8 year old daughter- very close   Childhood History:  By whom was/is the patient raised?: Both parents Description of patient's relationship with caregiver when they were a child: Very close with mother and father  Patient's description of current relationship with people who raised him/her: Very close with mother and father  Does patient have siblings?: Yes Number of Siblings: 2 Description of patient's current relationship with siblings: Brother and sister- very close  Did patient suffer any verbal/emotional/physical/sexual abuse as a child?: No Did patient suffer from severe childhood neglect?: No Has patient ever been sexually abused/assaulted/raped as an adolescent or adult?: No Was the patient ever a victim of a crime or a disaster?: No Witnessed domestic violence?: No Has patient been effected by domestic  violence as an adult?: No  Education:  Highest grade of school patient has completed: 12 Currently a Consulting civil engineer?: No Learning disability?: No  Employment/Work Situation:   Employment situation: Employed 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, [employer] recently  Patient's job has been impacted by current illness: No What is the longest time patient has a held a job?: 4 years  Where was the patient employed at that time?: [employer]  Has patient ever been in the [employer]?: No Has patient ever served in Buyer, retail?: No  Financial Resources:   Financial resources: Income from employment Does patient have a representative payee or guardian?: No  Alcohol/Substance Abuse:   What has been your use of drugs/alcohol within the last 12 months?: Pt reports binge drinking twice a month  Alcohol/Substance Abuse Treatment Hx: Denies past history Has alcohol/substance abuse ever caused legal problems?: No  Social Support System:   Conservation officer, nature Support System: Good Describe Community Support System: Family  Type of faith/religion: NA How does patient's faith help to cope with current illness?: NA  Leisure/Recreation:   Leisure and Hobbies: hair, spending time with daughter q  Strengths/Needs:   What things does the patient do well?: hair, taking care of her daughter  In what areas does patient struggle / problems for patient: depression, binge drinking, stressful job.   Discharge Plan:   Does patient have access to transportation?: Yes (Self ) Will patient be returning to same living situation after discharge?: Yes Currently receiving community mental health services: No If no, would patient like referral for services when discharged?: No Does patient have financial barriers related to discharge medications?: Yes Patient description of barriers related to discharge medications: Limited  income, no insurance    Summary/Recommendations:   Catherine Miles is a 24 year old female who presented to [facility] with depression, alcohol abuse and SI. Miles reports taking a handful of pills to overdose. Miles reports binging on alcohol twice a month. Miles states Miles becomes depressed when Miles drinks alcohol but when Miles starts Miles has a difficulties limiting her alcohol intake. Miles is employed, single and lives in [place] with her 59 year old daughter. Her daughter is staying with her mother during her admission. Catherine Miles reports her jobs as her primary stressor. Miles denies using any other substances. Pt is not currently receiving outpatient services and does not want a referral. Pt was provided information about [facility] crisis walk in. Pt plans to return home without medications or a referral to outpatient. Recommendations include; crisis stabilization, medication management, therapeutic milieu, and encourage group attendance and participation.   Catherine Miles Catherine Miles.MSW, [facility]  01/22/2015

## 2015-01-23 DIAGNOSIS — F1220 Cannabis dependence, uncomplicated: Secondary | ICD-10-CM

## 2015-01-23 DIAGNOSIS — F1020 Alcohol dependence, uncomplicated: Secondary | ICD-10-CM

## 2015-01-23 DIAGNOSIS — F4325 Adjustment disorder with mixed disturbance of emotions and conduct: Principal | ICD-10-CM

## 2015-01-23 DIAGNOSIS — F17200 Nicotine dependence, unspecified, uncomplicated: Secondary | ICD-10-CM

## 2015-01-23 HISTORY — DX: Nicotine dependence, unspecified, uncomplicated: F17.200

## 2015-01-23 HISTORY — DX: Cannabis dependence, uncomplicated: F12.20

## 2015-01-23 HISTORY — DX: Alcohol dependence, uncomplicated: F10.20

## 2015-01-23 LAB — TSH: TSH: 0.396 u[IU]/mL (ref 0.350–4.500)

## 2015-01-23 MED ORDER — MAGNESIUM HYDROXIDE 400 MG/5ML PO SUSP: 30.0000 mL | Freq: Every day | ORAL | Status: DC | PRN

## 2015-01-23 MED ORDER — HYDROXYZINE HCL 50 MG PO TABS: 50.0000 mg | ORAL_TABLET | Freq: Four times a day (QID) | ORAL | Status: DC | PRN

## 2015-01-23 MED ORDER — ACETAMINOPHEN 325 MG PO TABS: 650.0000 mg | ORAL_TABLET | Freq: Four times a day (QID) | ORAL | Status: DC | PRN

## 2015-01-23 MED ORDER — ALUM & MAG HYDROXIDE-SIMETH 200-200-20 MG/5ML PO SUSP: 30.0000 mL | ORAL | Status: DC | PRN

## 2015-01-23 NOTE — Plan of Care (Signed)
Problem: Alteration in mood Goal: LTG-Patient reports reduction in suicidal thoughts (Patient reports reduction in suicidal thoughts and is able to verbalize a safety plan for whenever patient is feeling suicidal)  Outcome: Progressing Denies suicidal ideations     

## 2015-01-23 NOTE — Progress Notes (Signed)
D: Patient stated slept good last night .Stated appetite is good and energy level  Is normal. Stated concentration is good . Stated on Depression scale , hopeless and anxiety .( low 0-10 high) Denies suicidal  homicidal ideations  .  No auditory hallucinations  No pain concerns . Appropriate ADL'S. Interacting with peers and staff. Aware of discharge tomorrow  A: Encourage patient participation with unit programming . Instruction  Given on  Medication , verbalize understanding. R: Voice no other concerns. Staff continue to monitor

## 2015-01-23 NOTE — Progress Notes (Signed)
Recreation Therapy Notes  Date: 08.22.16 Time: 3:00 pm Location: Craft Room  Group Topic: Self-expression  Goal Area(s) Addresses:  Patient will identify one color per emotion listed on the wheel. Patient will verbalize benefit of using art as a means of self-expression. Patient will verbalize one emotion experienced during session. Patient will be educated on other forms of self-expression.  Behavioral Response: Attentive, Left early  Intervention:  Emotion Wheel  Activity: Patients were given an Arboriculturist with seven emotions. Patients were instructed to pick a color for each emotion and color it in.  Education: LRT educated patients on different forms of self-expression.  Education Outcome: Acknowledges education/In group clarification offered  Clinical Observations/Feedback: Patient completed worksheet. Patient contributed to group discussion by stating what colors she picked for certain emotions. Patient left group at approximately 3:30 pm with social work. Patient did not return to group.  Jacquelynn Cree, LRT/CTRS 01/23/2015 4:36 PM

## 2015-01-23 NOTE — Progress Notes (Signed)
Recreation Therapy Notes  INPATIENT RECREATION THERAPY ASSESSMENT  Patient Details Name: Catherine Miles MRN: [id number] DOB: 06/28/1990 Today's Date: 02/07/2015  Patient Stressors: Death, Work, Other (Comment) (Not having free time)  Coping Skills:   Substance Abuse, Avoidance, Exercise, Art/Dance, Talking, Music, Other (Comment), Sports (Being honest with herself)  Personal Challenges: Communication, Concentration, Problem-Solving, Stress Management, Substance Abuse  Leisure Interests (2+):  Individual - Other (Comment) (Do hair and spend time with her daughter)  Awareness of Community Resources:  Yes  Community Resources:  [employer], [place]  Current Use: Yes (Yes to the park, not to [employer])  If no, Barriers?:    Patient Strengths:  "My life and my way of thinking"  Patient Identified Areas of Improvement:  Communication, being honest with herself  Current Recreation Participation:  Being a mother  Patient Goal for Hospitalization:  To come out the same the way she is now  [place] of Residence:  [facility] of Residence:  [place]   Current [place] (including self-harm):  No  Current HI:  No  Consent to Intern Participation: N/A   Jacquelynn Cree, LRT/CTRS 02/07/2015, 4:55 PM

## 2015-01-23 NOTE — Progress Notes (Signed)
[place] Day MD Progress Note  01/23/2015 12:27 PM Catherine Miles  MRN:  [id number] Subjective: Patient was admitted to our unit yesterday. This patient was referred by [facility] emergency department after she she presented there last Saturday after overdosing on ibuprofen and Tylenol and mixing that with a bottle of tequila. The patient made suicidal statements and was planning on getting in her car and crash it. Her mother contacted the police and she was brought in under involuntary commitment to the emergency department.  Patient reports being under severe stress over the last 2 weeks due to having to adjust to a very difficult schedule as she is now holding 2 jobs.  Patient is also the mother of a 25-year-old daughter and has no support from the father of her child.  Patient did acknowledge to drinking heavily on binges and smoking marijuana.  Patient denies ever having prior psychiatric treatment. There is no prior history of suicidal attempts. There is no history of substance abuse.  Patient reports feeling better and is more open to receive treatment after her discharge. Currently denies SI, HI or auditory or visual hallucinations.   Per nursing: Pt visible on the unit. Pleasant in good spirits. Denies SI. Attended group. Voices no compliants.  Principal Problem: Adjustment disorder with mixed disturbance of emotions and conduct Diagnosis:   Patient Active Problem List   Diagnosis Date Noted  . Adjustment disorder with mixed disturbance of emotions and conduct [F43.25] 01/23/2015  . Tobacco use disorder [Z72.0] 01/23/2015  . Alcohol use disorder, moderate, dependence [F10.20] 01/23/2015  . Cannabis use disorder, severe, dependence [F12.20] 01/23/2015   Total Time spent with patient: 30 minutes   Past Medical History:  Past Medical History  Diagnosis Date  . Allergy     Past Surgical History  Procedure Laterality Date  . Mandible surgery     Family History: History reviewed. No  pertinent family history. Social History:  History  Alcohol Use  . 7.2 oz/week  . 12 Shots of liquor per week    Comment: on weekends 6 or more drinks     History  Drug Use No    Social History   Social History  . Marital Status: Single    Spouse Name: N/A  . Number of Children: N/A  . Years of Education: N/A   Social History Main Topics  . Smoking status: Current Every Day Smoker -- 0.50 packs/day    Types: Cigarettes  . Smokeless tobacco: Never Used  . Alcohol Use: 7.2 oz/week    12 Shots of liquor per week     Comment: on weekends 6 or more drinks  . Drug Use: No  . Sexual Activity: Yes    Birth Control/ Protection: None   Other Topics Concern  . None   Social History Narrative   Additional History:    Sleep: Good  Appetite:  Good   Assessment:   Musculoskeletal: Strength & Muscle Tone: within normal limits Gait & Station: normal Patient leans: N/A   Psychiatric Specialty Exam: Physical Exam  Review of Systems  Constitutional: Negative.   HENT: Negative.   Eyes: Negative.   Respiratory: Negative.   Cardiovascular: Negative.   Gastrointestinal: Negative.   Genitourinary: Negative.   Musculoskeletal: Negative.   Skin: Negative.   Neurological: Negative.   Endo/Heme/Allergies: Negative.   Psychiatric/Behavioral: Positive for depression and substance abuse. Negative for suicidal ideas.    Blood pressure 115/60, pulse 73, temperature 97.9 F (36.6 C), temperature source Oral, resp. rate 20,  height 4\' 11"  (1.499 m), weight 46.267 kg (102 lb), last menstrual period 01/16/2015.Body mass index is 20.59 kg/(m^2).  General Appearance: Well Groomed  Patent attorney::  Good  Speech:  Normal Rate  Volume:  Normal  Mood:  Dysphoric  Affect:  Congruent  Thought Process:  Logical  Orientation:  Full (Time, Place, and Person)  Thought Content:  Hallucinations: None  Suicidal Thoughts:  No  Homicidal Thoughts:  No  Memory:  Immediate;   Good Recent;    Good Remote;   Good  Judgement:  Fair  Insight:  Fair  Psychomotor Activity:  Normal  Concentration:  Good  Recall:  NA  Fund of Knowledge:Good  Language: Good  Akathisia:  No  Handed:    AIMS (if indicated):     Assets:  Engineer, maintenance Physical Health Social Support Vocational/Educational  ADL's:  Intact  Cognition: WNL  Sleep:  Number of Hours: 7     Current Medications: Current Facility-Administered Medications  Medication Dose Route Frequency Provider Last Rate Last Dose  . acetaminophen (TYLENOL) tablet 650 mg  650 mg Oral Q6H PRN Jimmy Footman, MD      . alum & mag hydroxide-simeth (MAALOX/MYLANTA) 200-200-20 MG/5ML suspension 30 mL  30 mL Oral Q4H PRN Jimmy Footman, MD      . magnesium hydroxide (MILK OF MAGNESIA) suspension 30 mL  30 mL Oral Daily PRN Jimmy Footman, MD      . nicotine (NICOTROL) 10 MG inhaler 1 continuous puffing  1 continuous puffing Inhalation PRN Jimmy Footman, MD        Lab Results: No results found for this or any previous visit (from the past 48 hour(s)).  Physical Findings: AIMS: Facial and Oral Movements Muscles of Facial Expression: None, normal Lips and Perioral Area: None, normal Jaw: None, normal Tongue: None, normal,Extremity Movements Upper (arms, wrists, hands, fingers): None, normal Lower (legs, knees, ankles, toes): None, normal, Trunk Movements Neck, shoulders, hips: None, normal, Overall Severity Severity of abnormal movements (highest score from questions above): None, normal Incapacitation due to abnormal movements: None, normal Patient's awareness of abnormal movements (rate only patient's report): No Awareness, Dental Status Current problems with teeth and/or dentures?: No Does patient usually wear dentures?: No  CIWA:  CIWA-Ar Total: 0 COWS:     Treatment Plan Summary: Daily contact with patient to assess and evaluate symptoms and progress in treatment and  Medication management  24 year old with no prior psychiatric history who presented to the local emergency Department after overdosing on ibuprofen and Tylenol and alcohol. Patient started making suicidal threats and was trying to get into her car with the plan of crashing it and therefore killing herself.  Adjustment disorder versus major depressive disorder: Patient reports having issues with depression for the last 2 weeks after finding out she had a very difficult schedule due to having to hold 2 jobs in order to support herself and her 70-year-old daughter. Patient denies prior history of depression. For right now will hold off on restarting antidepressives   Insomnia: I will start the patient on Vistaril 50 mg every 6 hours as needed for anxiety or insomnia  Alcohol abuse: Currently no evidence of alcohol withdrawal  Cannabis use disorder: We'll refer the patient for outpatient substance abuse treatment once stable for discharge  Tobacco use disorder: Patient has been started on Nicotrol inhaler when necessary  Precautions every 15 minute checks  Labs: I will order TSH and vitamin B12  Discharge planning: Once stable the patient will be  discharged to her family and she will be scheduled to follow-up with outpatient substance abuse treatment in [place].  Possible discharge date tomorrow.  Medical Decision Making:  New problem, with additional work up planned     Jimmy Footman 01/23/2015, 12:27 PM

## 2015-01-24 LAB — VITAMIN B12: VITAMIN B 12: 418 pg/mL (ref 180–914)

## 2015-01-24 NOTE — Plan of Care (Signed)
Problem: [facility] Participation in Recreation Therapeutic Interventions Goal: STG-Patient will identify at least five coping skills for ** STG: Coping Skills - Within 3 treatment sessions, patient will verbalize at least 5 coping skills for substance abuse in one treatment session to decrease substance abuse post d/c.  Outcome: Completed/Met Date Met:  01/24/15 Treatment Session 1; Completed 1 out of 1: At approximately 9:25 am, LRT met with patient in craft room. Patient verbalized 5 coping skills for substance abuse. LRT educated patient on leisure and why it is important to implement it into her schedule. LRT provided patient with blank schedules to help her plan her day and try to avoid using substances. LRT educated patient on healthy support systems.  Leonette [facility], LRT/CTRS 08.23.16 2:30 pm Goal: STG-Patient will demonstrate improved communication skills b STG: Communication - Within 3 treatment sessions, patient will participate in communication activity in one treatment session to increase healthy communication skills post d/c.  Outcome: Completed/Met Date Met:  01/24/15 Treatment Session 1; Completed 1 out of 1: At approximately 9:25 am, LRT met with patient in craft room. Patient was instructed to take as much toilet paper as she would use in 2 hours. For each square, patient was instructed to give a fun fact about herself. Patient able to give a fun fact for each square. LRT educated patient on communication and trust.  Leonette [facility], LRT/CTRS 08.23.16 2:23 pm Goal: STG-Other Recreation Therapy Goal (Specify) STG: Stress Management - Within 3 treatment sessions, patient will verbalize understanding of the stress management techniques in one treatment session to increase stress management skills post d/c.  Outcome: Completed/Met Date Met:  01/24/15 Treatment Session 1; Completed 1 out of 1: At approximately 9:25 am, LRT met with patient in craft room. LRT educated and provided  patient with handouts on stress management techniques. Patient verbalized understanding. LRT encouraged patient to read over and practice the stress management techniques.  Leonette [facility], LRT/CTRS 08.23.16 2:24 pm

## 2015-01-24 NOTE — Discharge Summary (Signed)
Physician Discharge Summary Note  Patient:  Catherine Miles is an 24 y.o., female MRN:  [id number] DOB:  1990/12/02 Patient phone:  [phone] (home)  Patient address:   [address] [place] [address],  Total Time spent with patient: 30 minutes  Date of Admission:  01/21/2015 Date of Discharge: 01/24/15  Reason for Admission:  Suicidal threats  Principal Problem: Adjustment disorder with mixed disturbance of emotions and conduct Discharge Diagnoses: Patient Active Problem List   Diagnosis Date Noted  . Adjustment disorder with mixed disturbance of emotions and conduct [F43.25] 01/23/2015  . Tobacco use disorder [Z72.0] 01/23/2015  . Alcohol use disorder, moderate, dependence [F10.20] 01/23/2015  . Cannabis use disorder, severe, dependence [F12.20] 01/23/2015    Musculoskeletal: Strength & Muscle Tone: within normal limits Gait & Station: normal Patient leans: N/A  Psychiatric Specialty Exam: Physical Exam  Review of Systems  Constitutional: Negative.   HENT: Negative.   Eyes: Negative.   Respiratory: Negative.   Cardiovascular: Negative.   Gastrointestinal: Negative.   Genitourinary: Negative.   Musculoskeletal: Negative.   Skin: Negative.   Neurological: Negative.   Endo/Heme/Allergies: Negative.   Psychiatric/Behavioral: Negative.     Blood pressure 96/66, pulse 64, temperature 97.9 F (36.6 C), temperature source Oral, resp. rate 20, height 4\' 11"  (1.499 m), weight 46.267 kg (102 lb), last menstrual period 01/16/2015.Body mass index is 20.59 kg/(m^2).  General Appearance: Well Groomed  Patent attorney::  Good  Speech:  Normal Rate  Volume:  Normal  Mood:  Euthymic  Affect:  Congruent  Thought Process:  Linear  Orientation:  Full (Time, Place, and Person)  Thought Content:  Hallucinations: None  Suicidal Thoughts:  No  Homicidal Thoughts:  No  Memory:  Immediate;   Good Recent;   Good Remote;   Good  Judgement:  Fair  Insight:  Fair  Psychomotor  Activity:  Normal  Concentration:  Good  Recall:  NA  Fund of Knowledge:Good  Language: Good  Akathisia:  No  Handed:    AIMS (if indicated):     Assets:  Architect Housing Intimacy Physical Health Social Support Transportation Vocational/Educational  ADL's:  Intact  Cognition: WNL  Sleep:  Number of Hours: 7.25   History of Present Illness:: 25 year old woman referred on IVC from [facility]. Her chief complaint "I had a lot to drink". Patient evidently consumed a lot of alcohol possibly a whole bottle of tequila on Friday. She got very frustrated and made some suicidal statements. It says in the referral that she had taken pills. Patient says she only took an appropriate amount of medicine for a headache but didn't do anything to try and hurt her self. She denies having driven her car while intoxicated but says she was having suicidal thoughts. She was frustrated because her life is too stressful. 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 She lost her temper about it. She is not getting any outpatient mental health treatment at all. She says that she usually binge drinks on the weekend but before hadn't thought of it is a major problem. Hasn't been using any other drugs recently although she used to use marijuana. Patient has been depressed and anxious. Intermittently impaired sleep and appetite. No psychotic symptoms.  Past psychiatric history: Got some therapy in middle school but has not had any psychiatric treatment since then. No prescriptions for psychiatric medicine or prior suicide attempts.  Social history: Has a 42-year-old daughter. She has  her own place to live but has several people friends and relatives who are freeloading off of her. Works 2 jobs. Feels very stressed.  Medical history: No significant medical problems  Family history: Doesn't know of any family history of mental health  problems  Substance abuse history: No history of substance abuse treatment no history of DTs or seizures. Elements: Quality: Suicidal statements while intoxicated. Severity: Severe but transient. Timing: Happened yesterday or Friday. Duration: Now resolved. Context: Alcohol abuse. Associated Signs/Symptoms: Depression Symptoms: depressed mood, fatigue, suicidal thoughts without plan, (Hypo) Manic Symptoms: None Anxiety Symptoms: Excessive Worry, Psychotic Symptoms: None PTSD Symptoms: Avoidance: None  Past Medical History:  Past Medical History  Diagnosis Date  . Allergy     Past Surgical History  Procedure Laterality Date  . Mandible surgery     Family History: History reviewed. No pertinent family history. Social History:  History  Alcohol Use  . 7.2 oz/week  . 12 Shots of liquor per week    Comment: on weekends 6 or more drinks     History  Drug Use No    Social History   Social History  . Marital Status: Single    Spouse Name: N/A  . Number of Children: N/A  . Years of Education: N/A   Social History Main Topics  . Smoking status: Current Every Day Smoker -- 0.50 packs/day    Types: Cigarettes  . Smokeless tobacco: Never Used  . Alcohol Use: 7.2 oz/week    12 Shots of liquor per week     Comment: on weekends 6 or more drinks  . Drug Use: No  . Sexual Activity: Yes    Birth Control/ Protection: None   Other Topics Concern  . None   Social History Narrative     Hospital Course: 24 year old with no prior psychiatric history who presented to the local emergency Department after overdosing on ibuprofen and Tylenol and alcohol. Patient started making suicidal threats and was trying to get into her car with the plan of crashing it and therefore killing herself.  Adjustment disorder versus major depressive disorder: Patient reports having issues with depression for the last 2 weeks after finding out she had a very difficult schedule due to  having to hold 2 jobs in order to support herself and her 88-year-old daughter. Patient denies prior history of depression. For right now will hold off on restarting antidepressives   Insomnia: I will start the patient on Vistaril 50 mg every 6 hours as needed for anxiety or insomnia  Alcohol abuse: Currently no evidence of alcohol withdrawal  Cannabis use disorder: We'll refer the patient for outpatient substance abuse treatment once stable for discharge  Tobacco use disorder: Patient has been started on Nicotrol inhaler when necessary  Labs:  TSH wnl.  B12  Discharge planning: today the patient will be discharged to her family and she will be scheduled to follow-up with outpatient substance abuse treatment in [place].  This hospitalization was uneventful. Patient was pleasant and cooperative,  She attended groups. No behavioral issues, no aggression.  On the day of discharge she denied having SI, HI or A/VH.  Mood was euthymic, affect bright and reactive.    Discharge Vitals:   Blood pressure 96/66, pulse 64, temperature 97.9 F (36.6 C), temperature source Oral, resp. rate 20, height 4\' 11"  (1.499 m), weight 46.267 kg (102 lb), last menstrual period 01/16/2015. Body mass index is 20.59 kg/(m^2).  Lab Results:   Results for orders placed or performed during  the hospital encounter of 01/21/15 (from the past 72 hour(s))  TSH     Status: None   Collection Time: 01/23/15  2:00 PM  Result Value Ref Range   TSH 0.396 0.350 - 4.500 uIU/mL    Physical Findings: AIMS: Facial and Oral Movements Muscles of Facial Expression: None, normal Lips and Perioral Area: None, normal Jaw: None, normal Tongue: None, normal,Extremity Movements Upper (arms, wrists, hands, fingers): None, normal Lower (legs, knees, ankles, toes): None, normal, Trunk Movements Neck, shoulders, hips: None, normal, Overall Severity Severity of abnormal movements (highest score from questions above): None,  normal Incapacitation due to abnormal movements: None, normal Patient's awareness of abnormal movements (rate only patient's report): No Awareness, Dental Status Current problems with teeth and/or dentures?: No Does patient usually wear dentures?: No  CIWA:  CIWA-Ar Total: 0 COWS:       Discharge Instructions    Diet - low sodium heart healthy    Complete by:  As directed      Increase activity slowly    Complete by:  As directed             Medication List    Notice    You have not been prescribed any medications.         Follow-up Information    Follow up with [facility]. Go on 01/25/2015.   Why:  For follow-up care appointment walkin at 9:00am on Wednesday 01/25/15   Contact information:   [address] [place], [place] [phone] Fax [phone]        Total Discharge Time: 30 minutes  Signed: Jimmy Footman 01/24/2015, 7:34 AM

## 2015-01-24 NOTE — Progress Notes (Signed)
Recreation Therapy Notes  INPATIENT RECREATION TR PLAN  Patient Details Name: Catherine Miles MRN: [id number] DOB: April 25, 1991 Today's Date: 01/24/2015  Rec Therapy Plan Is patient appropriate for Therapeutic Recreation?: Yes Treatment times per week: At least once a week TR Treatment/Interventions: 1:1 session, Group participation (Comment) (Appropriate pariticipation in daily recreation therapy tx)  Discharge Criteria Pt will be discharged from therapy if:: Discharged, Treatment goals are met Treatment plan/goals/alternatives discussed and agreed upon by:: Patient/family  Discharge Summary Short term goals set: See Care Plan Short term goals met: Complete Progress toward goals comments: One-to-one attended Which groups?: Other (Comment) (Self-expression) One-to-one attended: Stress management, communication, coping skills Reason goals not met: N/A Therapeutic equipment acquired: None Reason patient discharged from therapy: Discharge from hospital Pt/family agrees with progress & goals achieved: Yes Date patient discharged from therapy: 01/24/15   Leon[facility], LRT/CTRS 01/24/2015, 2:26 PM

## 2015-01-24 NOTE — Progress Notes (Signed)
Nurs Dischg Note:  D:Patient denies SI/HI at this time. Pt appears calm and cooperative, and no distress noted.  A: All Personal items in locker returned to pt.Instructd on  Follow appointment , no perscriptions  Pt escorted out of the building Patient with mother and daugther.  R:  Pt States she will comply with outpatient services, and take MEDS as prescribed.

## 2015-01-24 NOTE — Progress Notes (Signed)
AVS H&P Discharge Summary faxed to [facility] for hospital follow-up °

## 2015-01-24 NOTE — Progress Notes (Signed)
  [facility] Adult Case Management Discharge Plan :  Will you be returning to the same living situation after discharge:  Yes,  home with her mother At discharge, do you have transportation home?: Yes,  patient's mother will pick up at discharge Do you have the ability to pay for your medications: Yes,  patient has Medicaid  Release of information consent forms completed and in the chart;  Patient's signature needed at discharge.  Patient to Follow up at: Follow-up Information    Follow up with [facility]. Go on 01/25/2015.   Why:  For follow-up care appointment walkin at 9:00am on Wednesday 01/25/15   Contact information:   [address] [place], [place] Ph [phone] Fax [phone]       Patient denies SI/HI: Yes,  patient denies SI/HI    Safety Planning and Suicide Prevention discussed: Yes,  SPE discussed with patient and her mother  Have you used any form of tobacco in the last 30 days? (Cigarettes, Smokeless Tobacco, Cigars, and/or Pipes): Yes  Has patient been referred to the Quitline?: Patient refused referral  Lulu Riding, MSW, LCSWA 01/24/2015, 9:32 AM

## 2015-01-24 NOTE — [facility] Suicide Risk Assessment (Signed)
[facility] Discharge Suicide Risk Assessment   Demographic Factors:  Adolescent or young adult  Total Time spent with patient: 30 minutes    Psychiatric Specialty Exam: Physical Exam  ROS                                                         Have you used any form of tobacco in the last 30 days? (Cigarettes, Smokeless Tobacco, Cigars, and/or Pipes): Yes  Has this patient used any form of tobacco in the last 30 days? (Cigarettes, Smokeless Tobacco, Cigars, and/or Pipes) Yes, A prescription for an FDA-approved tobacco cessation medication was offered at discharge and the patient refused  Mental Status Per Nursing Assessment::   On Admission:  NA  Current Mental Status by Physician: denies SI, HI or A/VH.  Mood euthymic, affect reactive.  Hopeful and future oriented  Loss Factors: NA  Historical Factors: NA  Risk Reduction Factors:   Responsible for children under 23 years of age, Sense of responsibility to family, Employed, Living with another person, especially a relative and Positive social support  Continued Clinical Symptoms:  Alcohol/Substance Abuse/Dependencies  Cognitive Features That Contribute To Risk:  None    Suicide Risk:  Minimal: No identifiable suicidal ideation.  Patients presenting with no risk factors but with morbid ruminations; may be classified as minimal risk based on the severity of the depressive symptoms  Principal Problem: Adjustment disorder with mixed disturbance of emotions and conduct Discharge Diagnoses:  Patient Active Problem List   Diagnosis Date Noted  . Adjustment disorder with mixed disturbance of emotions and conduct [F43.25] 01/23/2015  . Tobacco use disorder [Z72.0] 01/23/2015  . Alcohol use disorder, moderate, dependence [F10.20] 01/23/2015  . Cannabis use disorder, severe, dependence [F12.20] 01/23/2015    Follow-up Information    Follow up with [facility]. Go on 01/25/2015.   Why:  For follow-up care  appointment walkin at 9:00am on Wednesday 01/25/15   Contact information:   [address] [place], [place] Ph [phone] Fax [phone]         Is patient on multiple antipsychotic therapies at discharge:  No   Has Patient had three or more failed trials of antipsychotic monotherapy by history:  No  Recommended Plan for Multiple Antipsychotic Therapies: NA    Jimmy Footman 01/24/2015, 7:30 AM

## 2015-01-24 NOTE — [facility] Suicide Risk Assessment (Signed)
[facility] INPATIENT:  Family/Significant Other Suicide Prevention Education  Suicide Prevention Education:  Education Completed; [name] (mother) [phone] has been identified by the patient as the family member/significant other with whom the patient will be residing, and identified as the person(s) who will aid the patient in the event of a mental health crisis (suicidal ideations/suicide attempt).  With written consent from the patient, the family member/significant other has been provided the following suicide prevention education, prior to the and/or following the discharge of the patient.  The suicide prevention education provided includes the following:  Suicide risk factors  Suicide prevention and interventions  National Suicide Hotline telephone number  [facility] assessment telephone number  [facility] Emergency Assistance 911  [facility] and/or Residential Mobile Crisis Unit telephone number  Request made of family/significant other to:  Remove weapons (e.g., guns, rifles, knives), all items previously/currently identified as safety concern.    Remove drugs/medications (over-the-counter, prescriptions, illicit drugs), all items previously/currently identified as a safety concern.  The family member/significant other verbalizes understanding of the suicide prevention education information provided.  The family member/significant other agrees to remove the items of safety concern listed above.  Lulu Riding, MSW, LCSWA 01/24/2015, 9:31 AM

## 2015-08-10 ENCOUNTER — Emergency Department (HOSPITAL_COMMUNITY)
Admission: EM | Admit: 2015-08-10 | Discharge: 2015-08-10 | Disposition: A | Discharge status: Home / Self Care | Payer: Self-pay | Attending: Emergency Medicine | Admitting: Emergency Medicine

## 2015-08-10 ENCOUNTER — Encounter (HOSPITAL_COMMUNITY): Payer: Self-pay

## 2015-08-10 DIAGNOSIS — K1120 Sialoadenitis, unspecified: Secondary | ICD-10-CM | POA: Insufficient documentation

## 2015-08-10 DIAGNOSIS — F17210 Nicotine dependence, cigarettes, uncomplicated: Secondary | ICD-10-CM | POA: Insufficient documentation

## 2015-08-10 LAB — RAPID STREP SCREEN (MED CTR MEBANE ONLY): Streptococcus, Group A Screen (Direct): NEGATIVE

## 2015-08-10 MED ORDER — AMOXICILLIN-POT CLAVULANATE 875-125 MG PO TABS: 1.0000 | ORAL_TABLET | Freq: Two times a day (BID) | ORAL | Status: DC

## 2015-08-10 MED ORDER — IBUPROFEN 800 MG PO TABS: 800.0000 mg | ORAL_TABLET | Freq: Three times a day (TID) | ORAL | Status: DC | PRN

## 2015-08-10 NOTE — ED Provider Notes (Signed)
CSN: [id number]     Arrival date & time 08/10/15  1001 History   None    Chief Complaint  Patient presents with  . Sore Throat     (Consider location/radiation/quality/duration/timing/severity/associated sxs/prior Treatment) HPI Patient presents to the emergency department with sore throat for the last 2 days.  Patient states that she did not take anything prior to arrival for her symptoms.  Patient states that nothing seems make her condition better, but palpation of the jaw area on the right leg.  The pain worse.  Patient states that she does not have any tooth pain.  Patient states her gums are not swollen.  Patient has CERVICAL lymph nodes.  The patient denies chest pain, shortness of breath, cough, weakness, dizziness, headache, blurred vision, neck pain, fever, dysuria, incontinence, or syncope.  Patient states that nothing seems make her condition better Past Medical History  Diagnosis Date  . Allergy    Past Surgical History  Procedure Laterality Date  . Mandible surgery     History reviewed. No pertinent family history. Social History  Substance Use Topics  . Smoking status: Current Every Day Smoker -- 0.50 packs/day    Types: Cigarettes  . Smokeless tobacco: Never Used  . Alcohol Use: 7.2 oz/week    12 Shots of liquor per week     Comment: on weekends 6 or more drinks   OB History    No data available     Review of Systems  All other systems negative except as documented in the HPI. All pertinent positives and negatives as reviewed in the HPI.   Allergies  Review of patient's allergies indicates no known allergies.  Home Medications   Prior to Admission medications   Not on File   BP 110/66 mmHg  Pulse 102  Temp(Src) 98.3 F (36.8 C) (Oral)  Resp 16  SpO2 99% Physical Exam  Constitutional: She is oriented to person, place, and time. She appears well-developed and well-nourished. No distress.  HENT:  Head: Normocephalic and atraumatic.  Mouth/Throat:  Oropharynx is clear and moist.    Eyes: Pupils are equal, round, and reactive to light.  Neck: Normal range of motion. Neck supple.  Cardiovascular: Normal rate, regular rhythm and normal heart sounds.  Exam reveals no gallop and no friction rub.   No murmur heard. Pulmonary/Chest: Effort normal and breath sounds normal. No respiratory distress.  Lymphadenopathy:    She has cervical adenopathy.  Neurological: She is alert and oriented to person, place, and time. She exhibits normal muscle tone. Coordination normal.  Skin: Skin is warm and dry. No rash noted. No erythema.  Nursing note and vitals reviewed.   ED Course  Procedures (including critical care time) Labs Review Labs Reviewed  RAPID STRE[facility]CREEN ([facility])  CULTURE, GROUP A STREP [facility])    I have personally reviewed and evaluated these images and lab results as part of my medical decision-making.   Patient is referrred to ENT. Told to return here as needed. Patient treated for Parotid duct stone  Charlestine NightChristopher Owais Pruett, PA-C 08/14/15 1629  Derwood KaplanAnkit Nanavati, MD 08/16/15 1244

## 2015-08-10 NOTE — ED Notes (Signed)
Pt c/o increasing sore throat x 2 days. Pain score 7/10.  Pt has not taken anything for pain.  Pt works w/ the public.

## 2015-08-10 NOTE — Discharge Instructions (Signed)
Return here as needed. Follow up with the ENT doctor provided. You will need to suck on sour candy such as lemon candy to help with this.     Salivary Gland Infection A salivary gland infection is an infection in one or more of the glands that produce spit (saliva). You have six major salivary glands. Each gland has a duct that carries saliva into your mouth. Saliva keeps your mouth moist and breaks down the food that you eat. It also helps to prevent tooth decay. Two salivary glands are located just in front of your ears (parotid). The ducts for these glands open up inside your cheeks, near your back teeth. You also have two glands under your tongue (sublingual) and two glands under your jaw (submandibular). The ducts for these glands open under your tongue. Any salivary gland can become infected. Most infections occur in the parotid glands or submandibular glands. CAUSES Salivary glands can be infected by viruses or bacteria.  The mumps virus is the most common cause of viral salivary gland infections, though mumps is now rare in many areas because of vaccination.  This infection causes swelling in both parotid glands.  Viral infections are more common in children.  The bacteria that cause salivary gland infections are usually the same bacteria that normally live in your mouth.  A stone can form in a salivary gland and block the flow of saliva. As a result, saliva backs up into the salivary gland. Bacteria may then start to grow behind the blockage and cause infection.  Bacterial infections usually cause pain and swelling on one side of the face. Submandibular gland swelling occurs under the jaw. Parotid swelling occurs in front of the ear.  Bacterial infections are more common in adults. RISK FACTORS Children who do not get the MMR (measles, mumps, rubella) vaccine are more likely to get mumps, which can cause a viral salivary gland infection. Risk factors for bacterial infections  include:  Poor dental care (oral hygiene).  Smoking.  Not drinking enough water.  Having a disease that causes dry mouth and dry eyes (Mikulicz syndrome or Sjogren syndrome). SIGNS AND SYMPTOMS The main sign of salivary gland infection is a swollen salivary gland. This type of inflammation is often called sialadenitis. You may have swelling in front of your ear, under your jaw, or under your tongue. Swelling may get worse when you eat and decrease after you eat. Other signs and symptoms include:  Pain.  Tenderness.  Redness.  Dry mouth.  Bad taste in your mouth.  Difficulty chewing and swallowing.  Fever. DIAGNOSIS Your health care provider may suspect a salivary gland infection based on your signs and symptoms. He or she will also do a physical exam. The health care provider will look and feel inside your mouth to see whether a stone is blocking a salivary gland duct. You may need to see an ear, nose, and throat specialist (ENT or otolaryngologist) for diagnosis and treatment. You may also need to have diagnostic tests, such as:  An X-ray to check for a stone.  Other imaging studies to look for an abscess and to rule out other causes of swelling. These tests may include:  Ultrasound.  CT scan.  MRI.  Culture and sensitivity test. This involves collecting a sample of pus for testing in the lab to see what bacteria grow and what antibiotics they are sensitive to. The testing sample may be:  Swabbed from a salivary gland duct.  Withdrawn from a swollen  gland with a needle (aspiration). TREATMENT Viral salivary gland infections usually clear up without treatment. Bacterial infections are usually treated with antibiotic medicine. Severe infections that cause difficulty with swallowing may be treated with an IV antibiotic in the hospital. Other treatments may include:  Probing and widening the salivary duct to allow a stone to pass. In some cases, a thin, flexible scope  (endoscope) may be inserted into the duct to find a stone and remove it.  Breaking up a stone using sound waves.  Draining an infected gland (abscess) with a needle.  In some cases, you may need surgery so your health care provider can:  Remove a stone.  Drain pus from an abscess.  Remove a badly infected gland. HOME CARE INSTRUCTIONS  Take medicines only as directed by your health care provider.  If you were prescribed an antibiotic medicine, finish it all even if you start to feel better.  Follow these instructions every few hours:  Suck on a lemon candy to stimulate the flow of saliva.  Put a warm compress over the gland.  Gently massage the gland.  Drink enough fluid to keep your urine clear or pale yellow.  Rinse your mouth with a mixture of warm water and salt every few hours. To make this mixture, add a pinch of salt to 1 cup of warm water.  Practice good oral hygiene by brushing and flossing your teeth after meals and before you go to bed.  Do not use any tobacco products, including cigarettes, chewing tobacco, or electronic cigarettes. If you need help quitting, ask your health care provider. SEEK MEDICAL CARE IF:  You have pain and swelling in your face, jaw, or mouth after eating.  You have persistent swelling in any of these places:  In front of your ear.  Under your jaw.  Inside your mouth. SEEK IMMEDIATE MEDICAL CARE IF:   You have pain and swelling in your face, jaw, or mouth that are getting worse.  Your pain and swelling make it hard to swallow or breathe.   This information is not intended to replace advice given to you by your health care provider. Make sure you discuss any questions you have with your health care provider.   Document Released: 06/27/2004 Document Revised: 06/10/2014 Document Reviewed: 10/20/2013 Elsevier Interactive Patient Education [employer].

## 2015-08-12 LAB — CULTURE, GROUP A STREP (THRC)

## 2015-08-20 ENCOUNTER — Encounter (HOSPITAL_COMMUNITY): Payer: Self-pay | Admitting: Emergency Medicine

## 2015-08-20 ENCOUNTER — Emergency Department (HOSPITAL_COMMUNITY)
Admission: EM | Admit: 2015-08-20 | Discharge: 2015-08-20 | Disposition: A | Discharge status: Home / Self Care | Payer: MEDICAID | Attending: Emergency Medicine | Admitting: Emergency Medicine

## 2015-08-20 DIAGNOSIS — H109 Unspecified conjunctivitis: Secondary | ICD-10-CM | POA: Insufficient documentation

## 2015-08-20 DIAGNOSIS — F17210 Nicotine dependence, cigarettes, uncomplicated: Secondary | ICD-10-CM | POA: Insufficient documentation

## 2015-08-20 MED ORDER — ERYTHROMYCIN 5 MG/GM OP OINT: TOPICAL_OINTMENT | OPHTHALMIC | Status: DC

## 2015-08-20 NOTE — ED Notes (Signed)
Woke up with right eye drainage, burning and itching with red sclera and conjunctiva. No symptoms in left eye. States it feels like the last time she had pink eye. States her work sent her home for it today and she needs a note to return to work.

## 2015-08-20 NOTE — ED Provider Notes (Signed)
CSN: [id number]     Arrival date & time 08/20/15  0935 History   First MD Initiated Contact with Patient 08/20/15 1003     Chief Complaint  Patient presents with  . Conjunctivitis     (Consider location/radiation/quality/duration/timing/severity/associated sxs/prior Treatment) Patient is a 25 y.o. female presenting with conjunctivitis. The history is provided by the patient.  Conjunctivitis This is a new problem. The current episode started today. The problem occurs constantly. The problem has been unchanged. Pertinent negatives include no abdominal pain, chills, coughing, fever, headaches, nausea, neck pain or vomiting. Nothing aggravates the symptoms. Treatments tried: cold compresses. The treatment provided mild relief.    [facility] is a 25 y.o. female with no significant PMH who presents with sudden onset, constant, unchanged right eye drainage, pain, and redness this morning.  Patient reports she woke up with her right eye crusted and matted shut with discharge.  She has applied cold compresses with mild relief.  She states this feels similar to when she had pink eye last year.  She states she was recently sick with sore throat. Denies visual disturbance, photophobia, headache, fever, chills, N/V.  She does not wear contacts.  Denies trauma or foreign body.    Past Medical History  Diagnosis Date  . Allergy    Past Surgical History  Procedure Laterality Date  . Mandible surgery     History reviewed. No pertinent family history. Social History  Substance Use Topics  . Smoking status: Current Every Day Smoker -- 0.50 packs/day    Types: Cigarettes  . Smokeless tobacco: Never Used  . Alcohol Use: 7.2 oz/week    12 Shots of liquor per week     Comment: on weekends 6 or more drinks   OB History    No data available     Review of Systems  Constitutional: Negative for fever and chills.  HENT: Negative for facial swelling.   Eyes: Positive for pain, discharge, redness  and itching. Negative for photophobia and visual disturbance.  Respiratory: Negative for cough.   Gastrointestinal: Negative for nausea, vomiting and abdominal pain.  Musculoskeletal: Negative for neck pain.  Neurological: Negative for headaches.  All other systems reviewed and are negative.     Allergies  Review of patient's allergies indicates no known allergies.  Home Medications   Prior to Admission medications   Medication Sig Start Date End Date Taking? Authorizing Provider  amoxicillin-clavulanate (AUGMENTIN) 875-125 MG tablet Take 1 tablet by mouth 2 (two) times daily. 08/10/15   Charlestine Nighthristopher Lawyer, PA-C  erythromycin ophthalmic ointment Place a 1/2 inch ribbon of ointment into the lower eyelid. 08/20/15   Cheri FowlerKayla Carmin Alvidrez, PA-C  ibuprofen (ADVIL,MOTRIN) 800 MG tablet Take 1 tablet (800 mg total) by mouth every 8 (eight) hours as needed. 08/10/15   Christopher Lawyer, PA-C   BP 114/70 mmHg  Pulse 113  Temp(Src) 98.4 F (36.9 C) (Oral)  Resp 18  SpO2 96% Physical Exam  Constitutional: She is oriented to person, place, and time. She appears well-developed and well-nourished.  HENT:  Head: Normocephalic and atraumatic. Head is without right periorbital erythema and without left periorbital erythema.  Right Ear: External ear normal.  Left Ear: External ear normal.  Eyes: EOM and lids are normal. Pupils are equal, round, and reactive to light. Lids are everted and swept, no foreign bodies found. Right eye exhibits no chemosis, no discharge, no exudate and no hordeolum. No foreign body present in the right eye. Left eye exhibits no chemosis, no discharge,  no exudate and no hordeolum. No foreign body present in the left eye. Right conjunctiva is injected. Right conjunctiva has no hemorrhage. No scleral icterus.  Neck: No tracheal deviation present.  Pulmonary/Chest: Effort normal. No respiratory distress.  Abdominal: She exhibits no distension.  Musculoskeletal: Normal range of motion.   Neurological: She is alert and oriented to person, place, and time.  Skin: Skin is warm and dry.  Psychiatric: She has a normal mood and affect. Her behavior is normal.    ED Course  Procedures (including critical care time) Labs Review Labs Reviewed - No data to display  Imaging Review No results found. I have personally reviewed and evaluated these images and lab results as part of my medical decision-making.   EKG Interpretation None      MDM   Final diagnoses:  Conjunctivitis of right eye   Patient presents with right eye discharge, pain, and redness.  No fevers, headache, photophobia, or eye swelling.  Eye exam unremarkable.  Patient will be treated with erythromycin opthalmic ointment.  Doubt FB.  Doubt periorbital or preseptal cellulitis.  Discussed return precautions.  Follow up opthalmology.  Discussed return precautions.  Patient agrees and acknowledges the above plan for discharge.     Cheri Fowler, PA-C 08/20/15 1020  Alvira Monday, MD 08/21/15 0000

## 2015-08-20 NOTE — Discharge Instructions (Signed)
Bacterial Conjunctivitis °Bacterial conjunctivitis, commonly called pink eye, is an inflammation of the clear membrane that covers the white part of the eye (conjunctiva). The inflammation can also happen on the underside of the eyelids. The blood vessels in the conjunctiva become inflamed, causing the eye to become red or pink. Bacterial conjunctivitis may spread easily from one eye to another and from person to person (contagious).  °CAUSES  °Bacterial conjunctivitis is caused by bacteria. The bacteria may come from your own skin, your upper respiratory tract, or from someone else with bacterial conjunctivitis. °SYMPTOMS  °The normally white color of the eye or the underside of the eyelid is usually pink or red. The pink eye is usually associated with irritation, tearing, and some sensitivity to light. Bacterial conjunctivitis is often associated with a thick, yellowish discharge from the eye. The discharge may turn into a crust on the eyelids overnight, which causes your eyelids to stick together. If a discharge is present, there may also be some blurred vision in the affected eye. °DIAGNOSIS  °Bacterial conjunctivitis is diagnosed by your caregiver through an eye exam and the symptoms that you report. Your caregiver looks for changes in the surface tissues of your eyes, which may point to the specific type of conjunctivitis. A sample of any discharge may be collected on a cotton-tip swab if you have a severe case of conjunctivitis, if your cornea is affected, or if you keep getting repeat infections that do not respond to treatment. The sample will be sent to a lab to see if the inflammation is caused by a bacterial infection and to see if the infection will respond to antibiotic medicines. °TREATMENT  °1. Bacterial conjunctivitis is treated with antibiotics. Antibiotic eyedrops are most often used. However, antibiotic ointments are also available. Antibiotics pills are sometimes used. Artificial tears or eye  washes may ease discomfort. °HOME CARE INSTRUCTIONS  °1. To ease discomfort, apply a cool, clean washcloth to your eye for 10-20 minutes, 3-4 times a day. °2. Gently wipe away any drainage from your eye with a warm, wet washcloth or a cotton ball. °3. Wash your hands often with soap and water. Use paper towels to dry your hands. °4. Do not share towels or washcloths. This may spread the infection. °5. Change or wash your pillowcase every day. °6. You should not use eye makeup until the infection is gone. °7. Do not operate machinery or drive if your vision is blurred. °8. Stop using contact lenses. Ask your caregiver how to sterilize or replace your contacts before using them again. This depends on the type of contact lenses that you use. °9. When applying medicine to the infected eye, do not touch the edge of your eyelid with the eyedrop bottle or ointment tube. °SEEK IMMEDIATE MEDICAL CARE IF:  °· Your infection has not improved within 3 days after beginning treatment. °· You had yellow discharge from your eye and it returns. °· You have increased eye pain. °· Your eye redness is spreading. °· Your vision becomes blurred. °· You have a fever or persistent symptoms for more than 2-3 days. °· You have a fever and your symptoms suddenly get worse. °· You have facial pain, redness, or swelling. °MAKE SURE YOU:  °· Understand these instructions. °· Will watch your condition. °· Will get help right away if you are not doing well or get worse. °  °This information is not intended to replace advice given to you by your health care provider. Make sure you   discuss any questions you have with your health care provider. °  °Document Released: 05/20/2005 Document Revised: 06/10/2014 Document Reviewed: 10/21/2011 °Elsevier Interactive Patient Education ©2016 Elsevier Inc[employer] ° °How to Use Eye Drops and Eye Ointments °HOW TO APPLY EYE DROPS °Follow these steps when applying eye drops: °2. Wash your hands. °3. Tilt your head  back. °4. Put a finger under your eye and use it to gently pull your lower lid downward. Keep that finger in place. °5. Using your other hand, hold the dropper between your thumb and index finger. °6. Position the dropper just over the edge of the lower lid. Hold it as close to your eye as you can without touching the dropper to your eye. °7. Steady your hand. One way to do this is to lean your index finger against your brow. °8. Look up. °9. Slowly and gently squeeze one drop of medicine into your eye. °10. Close your eye. °11. Place a finger between your lower eyelid and your nose. Press gently for 2 minutes. This increases the amount of time that the medicine is exposed to the eye. It also reduces side effects that can develop if the drop gets into the bloodstream through the nose. °HOW TO APPLY EYE OINTMENTS °Follow these steps when applying eye ointments: °10. Wash your hands. °11. Put a finger under your eye and use it to gently pull your lower lid downward. Keep that finger in place. °12. Using your other hand, place the tip of the tube between your thumb and index finger with the remaining fingers braced against your cheek or nose. °13. Hold the tube just over the edge of your lower lid without touching the tube to your lid or eyeball. °14. Look up. °15. Line the inner part of your lower lid with ointment. °16. Gently pull up on your upper lid and look down. This will force the ointment to spread over the surface of the eye. °17. Release the upper lid. °18. If you can, close your eyes for 1-2 minutes. °Do not rub your eyes. If you applied the ointment correctly, your vision will be blurry for a few minutes. This is normal. °ADDITIONAL INFORMATION °· Make sure to use the eye drops or ointment as told by your health care provider. °· If you have been told to use both eye drops and an eye ointment, apply the eye drops first, then wait 3-4 minutes before you apply the ointment. °· Try not to touch the tip of the  dropper or tube to your eye. A dropper or tube that has touched the eye can become contaminated. °  °This information is not intended to replace advice given to you by your health care provider. Make sure you discuss any questions you have with your health care provider. °  °Document Released: 08/26/2000 Document Revised: 10/04/2014 Document Reviewed: 05/16/2014 °Elsevier Interactive Patient Education ©2016 Elsevier Inc[employer] ° °

## 2018-04-08 ENCOUNTER — Emergency Department (HOSPITAL_COMMUNITY)
Admission: EM | Admit: 2018-04-08 | Discharge: 2018-04-09 | Disposition: A | Discharge status: Home / Self Care | Payer: Self-pay | Attending: Emergency Medicine | Admitting: Emergency Medicine

## 2018-04-08 ENCOUNTER — Emergency Department (HOSPITAL_COMMUNITY): Payer: Self-pay

## 2018-04-08 ENCOUNTER — Other Ambulatory Visit: Payer: Self-pay

## 2018-04-08 ENCOUNTER — Encounter (HOSPITAL_COMMUNITY): Payer: Self-pay | Admitting: Emergency Medicine

## 2018-04-08 DIAGNOSIS — W1789XA Other fall from one level to another, initial encounter: Secondary | ICD-10-CM | POA: Insufficient documentation

## 2018-04-08 DIAGNOSIS — Y939 Activity, unspecified: Secondary | ICD-10-CM | POA: Insufficient documentation

## 2018-04-08 DIAGNOSIS — Y999 Unspecified external cause status: Secondary | ICD-10-CM | POA: Insufficient documentation

## 2018-04-08 DIAGNOSIS — Y929 Unspecified place or not applicable: Secondary | ICD-10-CM | POA: Insufficient documentation

## 2018-04-08 DIAGNOSIS — S82832A Other fracture of upper and lower end of left fibula, initial encounter for closed fracture: Secondary | ICD-10-CM | POA: Insufficient documentation

## 2018-04-08 DIAGNOSIS — F17210 Nicotine dependence, cigarettes, uncomplicated: Secondary | ICD-10-CM | POA: Insufficient documentation

## 2018-04-08 MED ORDER — OXYCODONE-ACETAMINOPHEN 5-325 MG PO TABS: 1.0000 | ORAL_TABLET | ORAL | 0 refills | Status: DC | PRN

## 2018-04-08 MED ORDER — OXYCODONE-ACETAMINOPHEN 5-325 MG PO TABS
1.0000 | ORAL_TABLET | Freq: Once | ORAL | Status: AC
  Administered 2018-04-08: 1 via ORAL
  Filled 2018-04-08: qty 1

## 2018-04-08 MED ORDER — IBUPROFEN 800 MG PO TABS: 800.0000 mg | ORAL_TABLET | Freq: Three times a day (TID) | ORAL | 0 refills | Status: DC

## 2018-04-08 NOTE — Discharge Instructions (Signed)
Take the prescribed medication as directed.  Try to stay off your feet as much as possible. Follow-up with Dr. Roda Shutters-- call his office in the morning to arrange appt. Return to the ED for new or worsening symptoms.

## 2018-04-08 NOTE — ED Triage Notes (Signed)
Pt c/o left ankle pain and swelling after falling from the porch today.

## 2018-04-08 NOTE — ED Provider Notes (Signed)
[facility] EMERGENCY DEPARTMENT Provider Note   CSN: [id number] Arrival date & time: 04/08/18  2056     History   Chief Complaint Chief Complaint  Patient presents with  . Ankle Pain    HPI Catherine Miles is a 27 y.o. female.  The history is provided by the patient and medical records.  Ankle Pain      27 year old female here with left ankle injury.  States she was getting out of a cup and misjudged how far down the step was twisted her left ankle and fell to the ground.  No head injury or loss of consciousness.  States she has not been able to walk or bear weight on her left foot since this occurred.  Complains of pain only in the left ankle.  No prior ankle injuries or surgeries in the past.  She has not had any medications prior to arrival.  Past Medical History:  Diagnosis Date  . Allergy     Patient Active Problem List   Diagnosis Date Noted  . 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 Tobacco use disorder 01/23/2015  . Alcohol use disorder, moderate, dependence (HCC) 01/23/2015  . Cannabis use disorder, severe, dependence (HCC) 01/23/2015    Past Surgical History:  Procedure Laterality Date  . MANDIBLE SURGERY       OB History   None      Home Medications    Prior to Admission medications   Medication Sig Start Date End Date Taking? Authorizing Provider  amoxicillin-clavulanate (AUGMENTIN) 875-125 MG tablet Take 1 tablet by mouth 2 (two) times daily. 08/10/15   Lawyer, Cristal Deer, PA-C  erythromycin ophthalmic ointment Place a 1/2 inch ribbon of ointment into the lower eyelid. 08/20/15   Cheri Fowler, PA-C  ibuprofen (ADVIL,MOTRIN) 800 MG tablet Take 1 tablet (800 mg total) by mouth every 8 (eight) hours as needed. 08/10/15   Lawyer, Cristal Deer, PA-C    Family History No family history on file.  Social History Social History   Tobacco Use  . Smoking status: Current Every Day Smoker   Packs/day: 0.50    Types: Cigarettes  . Smokeless tobacco: Never Used  Substance Use Topics  . Alcohol use: Yes    Alcohol/week: 12.0 standard drinks    Types: 12 Shots of liquor per week    Comment: on weekends 6 or more drinks  . Drug use: No     Allergies   Patient has no known allergies.   Review of Systems Review of Systems  Musculoskeletal: Positive for arthralgias and joint swelling.  All other systems reviewed and are negative.    Physical Exam Updated Vital Signs BP 110/67 (BP Location: Left Arm)   Pulse (!) 112   Temp 97.9 F (36.6 C) (Oral)   Resp 18   SpO2 98%   Physical Exam  Constitutional: She is oriented to person, place, and time. She appears well-developed and well-nourished.  HENT:  Head: Normocephalic and atraumatic.  Mouth/Throat: Oropharynx is clear and moist.  Eyes: Pupils are equal, round, and reactive to light. Conjunctivae and EOM are normal.  Neck: Normal range of motion.  Cardiovascular: Normal rate, regular rhythm and normal heart sounds.  Pulmonary/Chest: Effort normal and breath sounds normal.  Abdominal: Soft. Bowel sounds are normal.  Musculoskeletal: Normal range of motion.  Left leg without any proximal leg tenderness or swelling; left ankle with swelling surrounding lateral malleolus and distal lower leg; there is no gross deformity; limited  ROM of ankle due to pain/swelling; DP pulse intact; moving toes normally; normal distal sensation and perfusion  Neurological: She is alert and oriented to person, place, and time.  Skin: Skin is warm and dry.  Psychiatric: She has a normal mood and affect.  Nursing note and vitals reviewed.    ED Treatments / Results  Labs (all labs ordered are listed, but only abnormal results are displayed) Labs Reviewed - No data to display  EKG None  Radiology Dg Ankle Complete Left  Result Date: 04/08/2018 CLINICAL DATA:  Left ankle pain after twisting injury and fall today. EXAM: LEFT ANKLE  COMPLETE - 3+ VIEW COMPARISON:  None. FINDINGS: Oblique fracture of the distal fibula with mild posterior and lateral displacement of the distal fracture fragments. Diffuse lateral soft tissue swelling. No other fractures identified. IMPRESSION: Oblique fracture of the distal fibula with mild displacement. Electronically Signed   By: Burman Nieves M.D.   On: 04/08/2018 22:09    Procedures Procedures (including critical care time)  Medications Ordered in ED Medications  oxyCODONE-acetaminophen (PERCOCET/ROXICET) 5-325 MG per tablet 1 tablet (has no administration in time range)     Initial Impression / Assessment and Plan / ED Course  I have reviewed the triage vital signs and the nursing notes.  Pertinent labs & imaging results that were available during my care of the patient were reviewed by me and considered in my medical decision making (see chart for details).  27 year old female presenting to the ED with left ankle injury after falling while trying to get out of a truck.  States she misjudged how far up the steps lost.  Has tenderness along the lateral aspect of distal lower leg and lateral ankle.  There is no significant deformity.  Compartments remain soft, foot is neurovascularly intact.  She has no tenderness, deformity, or swelling of the proximal left lower leg.  X-ray with distal fibula fracture.  Patient placed in short leg splint.  Given orthopedic follow-up.  Percocet for pain control.  She will return here for any new/acute changes.  Final Clinical Impressions(s) / ED Diagnoses   Final diagnoses:  Other closed fracture of distal end of left fibula, initial encounter    ED Discharge Orders         Ordered    oxyCODONE-acetaminophen (PERCOCET) 5-325 MG tablet  Every 4 hours PRN     04/08/18 2332    ibuprofen (ADVIL,MOTRIN) 800 MG tablet  3 times daily     04/08/18 2332           Garlon Hatchet, PA-C 04/08/18 2358    Raeford Razor, MD 04/09/18 1001

## 2018-04-08 NOTE — ED Notes (Signed)
Ice pack given to patient.

## 2018-04-09 NOTE — ED Notes (Signed)
Patient verbalizes understanding of discharge instructions. Opportunity for questioning and answers were provided. Armband removed by staff, pt discharged from ED home via POV.  

## 2018-04-16 ENCOUNTER — Ambulatory Visit (INDEPENDENT_AMBULATORY_CARE_PROVIDER_SITE_OTHER): Payer: Self-pay | Admitting: Orthopaedic Surgery

## 2018-04-24 ENCOUNTER — Ambulatory Visit (INDEPENDENT_AMBULATORY_CARE_PROVIDER_SITE_OTHER): Payer: Self-pay

## 2018-04-24 ENCOUNTER — Encounter (INDEPENDENT_AMBULATORY_CARE_PROVIDER_SITE_OTHER): Payer: Self-pay | Admitting: Orthopaedic Surgery

## 2018-04-24 ENCOUNTER — Ambulatory Visit (INDEPENDENT_AMBULATORY_CARE_PROVIDER_SITE_OTHER): Payer: Self-pay | Admitting: Orthopaedic Surgery

## 2018-04-24 DIAGNOSIS — M25572 Pain in left ankle and joints of left foot: Secondary | ICD-10-CM

## 2018-04-24 DIAGNOSIS — S82832A Other fracture of upper and lower end of left fibula, initial encounter for closed fracture: Secondary | ICD-10-CM
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
## 2018-05-05 ENCOUNTER — Encounter (INDEPENDENT_AMBULATORY_CARE_PROVIDER_SITE_OTHER): Payer: Self-pay

## 2018-05-22 ENCOUNTER — Ambulatory Visit (INDEPENDENT_AMBULATORY_CARE_PROVIDER_SITE_OTHER): Payer: Self-pay | Admitting: Orthopaedic Surgery

## 2018-06-09 ENCOUNTER — Ambulatory Visit (INDEPENDENT_AMBULATORY_CARE_PROVIDER_SITE_OTHER): Payer: Self-pay | Admitting: Orthopaedic Surgery

## 2018-06-11 ENCOUNTER — Ambulatory Visit (INDEPENDENT_AMBULATORY_CARE_PROVIDER_SITE_OTHER): Payer: Self-pay

## 2018-06-11 ENCOUNTER — Ambulatory Visit (INDEPENDENT_AMBULATORY_CARE_PROVIDER_SITE_OTHER): Payer: Self-pay | Admitting: Orthopaedic Surgery

## 2018-06-11 DIAGNOSIS — S82832D Other fracture of upper and lower end of left fibula, subsequent encounter for closed fracture with routine healing: Secondary | ICD-10-CM

## 2018-06-11 NOTE — Progress Notes (Signed)
Office Visit Note   Patient: Catherine Miles           Date of Birth: 07-16-1990           MRN: [id number] Visit Date: 06/11/2018              Requested by: No referring provider defined for this encounter. PCP: Patient, No Pcp Per   Assessment & Plan: Visit Diagnoses:  1. Closed fracture of distal end of left fibula with routine healing, unspecified fracture morphology, subsequent encounter     Plan: Impression is 9 weeks status post left distal fibula fracture.  At this point, we will allow the patient to be full weightbearing in her cam walker.  We will start her in formal physical therapy.  A prescription was provided today to the patient.  Follow-up with [place] in 4 weeks time for repeat evaluation and 3 view x-rays of the left ankle.  Anticipate transitioning her to an ASO brace.  Follow-Up Instructions: Return in about 4 weeks (around 07/09/2018).   Orders:  Orders Placed This Encounter  Procedures  . XR Ankle 2 Views Left   No orders of the defined types were placed in this encounter.     Procedures: No procedures performed   Clinical Data: No additional findings.   Subjective: Chief Complaint  Patient presents with  . Left Ankle - Follow-up    HPI patient is a pleasant 28 year old female who presents to our clinic today 9 weeks status post left distal fibula fracture, date of injury 04/08/2018.  She has been noncompliant with her nonweightbearing restrictions.  She has been wearing her cam walker but does note that she went to work 1 day without wearing it.  Overall, she notices significant improvement of symptoms.  Review of Systems as detailed in HPI.  All others reviewed and are negative.   Objective: Vital Signs: There were no vitals taken for this visit.  Physical Exam well-developed well-nourished female no acute distress.  Alert and oriented x3.  Ortho Exam examination of her left ankle reveals no swelling.  No tenderness to the distal fibula.  Fairly  good range of motion about the ankle.  Specialty Comments:  No specialty comments available.  Imaging: Xr Ankle 2 Views Left  Result Date: 06/11/2018 X-rays demonstrate stable alignment of the fracture with evidence of bony callus and consolidation    PMFS History: Patient Active Problem List   Diagnosis Date Noted  . Closed fracture of distal end of left fibula with routine healing 06/11/2018  . 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 Tobacco use disorder 01/23/2015  . Alcohol use disorder, moderate, dependence (HCC) 01/23/2015  . Cannabis use disorder, severe, dependence (HCC) 01/23/2015   Past Medical History:  Diagnosis Date  . Allergy     No family history on file.  Past Surgical History:  Procedure Laterality Date  . MANDIBLE SURGERY     Social History   Occupational History  . Not on file  Tobacco Use  . Smoking status: Current Every Day Smoker    Packs/day: 0.50    Types: Cigarettes  . Smokeless tobacco: Never Used  Substance and Sexual Activity  . Alcohol use: Yes    Alcohol/week: 12.0 standard drinks    Types: 12 Shots of liquor per week    Comment: on weekends 6 or more drinks  . Drug use: No  . Sexual activity: Yes    Birth control/protection: None

## 2018-07-09 ENCOUNTER — Ambulatory Visit (INDEPENDENT_AMBULATORY_CARE_PROVIDER_SITE_OTHER): Payer: Self-pay | Admitting: Orthopaedic Surgery

## 2018-07-16 ENCOUNTER — Ambulatory Visit (INDEPENDENT_AMBULATORY_CARE_PROVIDER_SITE_OTHER): Payer: Self-pay

## 2018-07-16 ENCOUNTER — Encounter (INDEPENDENT_AMBULATORY_CARE_PROVIDER_SITE_OTHER): Payer: Self-pay | Admitting: Orthopaedic Surgery

## 2018-07-16 ENCOUNTER — Ambulatory Visit (INDEPENDENT_AMBULATORY_CARE_PROVIDER_SITE_OTHER): Payer: Self-pay | Admitting: Orthopaedic Surgery

## 2018-07-16 DIAGNOSIS — M25572 Pain in left ankle and joints of left foot: Secondary | ICD-10-CM

## 2018-07-16 DIAGNOSIS — S82832D Other fracture of upper and lower end of left fibula, subsequent encounter for closed fracture with routine healing: Secondary | ICD-10-CM

## 2018-07-16 NOTE — Progress Notes (Signed)
   Office Visit Note   Patient: Catherine Miles           Date of Birth: 06/08/1990           MRN: [id number] Visit Date: 07/16/2018              Requested by: No referring provider defined for this encounter. PCP: Patient, No Pcp Per   Assessment & Plan: Visit Diagnoses:  1. Closed fracture of distal end of left fibula with routine healing, unspecified fracture morphology, subsequent encounter   2. Pain in left ankle and joints of left foot     Plan: Impression is clinically healed left distal fibula fracture.  We will transition the patient into an ASO brace weightbearing as tolerated.  I have given her a new prescription for formal physical therapy which she has agreed to attend.  She will follow-up with [place] in 4 weeks time for repeat evaluation and a final x-ray.  Follow-Up Instructions: Return in about 4 weeks (around 08/13/2018).   Orders:  Orders Placed This Encounter  Procedures  . XR Ankle Complete Left   No orders of the defined types were placed in this encounter.     Procedures: No procedures performed   Clinical Data: No additional findings.   Subjective: Chief Complaint  Patient presents with  . Left Ankle - Pain, Follow-up    HPI patient is a pleasant 28 year old female who presents to our clinic today approximately 13-week status post left distal fibula fracture.  She has been doing well.  She has been noncompliant wearing her boot.  She never went to physical therapy which was recommended at her last visit.  She denies any pain to the left ankle.  Review of Systems as detailed in HPI.  All others reviewed and are negative.   Objective: Vital Signs: There were no vitals taken for this visit.  Physical Exam well-developed well-nourished female no acute distress.  Alert and oriented x3.  Ortho Exam examination of her left ankle reveals no swelling.  No tenderness to the distal fibula.  Somewhat limited range of motion secondary to stiffness.  She is  neurovascularly intact distally.  Specialty Comments:  No specialty comments available.  Imaging: Xr Ankle Complete Left  Result Date: 07/16/2018 X-rays demonstrate stable alignment of the fracture with bony consolidation    PMFS History: Patient Active Problem List   Diagnosis Date Noted  . Closed fracture of distal end of left fibula with routine healing 06/11/2018  . 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 Tobacco use disorder 01/23/2015  . Alcohol use disorder, moderate, dependence (HCC) 01/23/2015  . Cannabis use disorder, severe, dependence (HCC) 01/23/2015   Past Medical History:  Diagnosis Date  . Allergy     History reviewed. No pertinent family history.  Past Surgical History:  Procedure Laterality Date  . MANDIBLE SURGERY     Social History   Occupational History  . Not on file  Tobacco Use  . Smoking status: Current Every Day Smoker    Packs/day: 0.50    Types: Cigarettes  . Smokeless tobacco: Never Used  Substance and Sexual Activity  . Alcohol use: Yes    Alcohol/week: 12.0 standard drinks    Types: 12 Shots of liquor per week    Comment: on weekends 6 or more drinks  . Drug use: No  . Sexual activity: Yes    Birth control/protection: None

## 2018-08-18 ENCOUNTER — Ambulatory Visit (INDEPENDENT_AMBULATORY_CARE_PROVIDER_SITE_OTHER): Payer: Self-pay | Admitting: Orthopaedic Surgery

## 2019-01-18 IMAGING — DX DG ANKLE COMPLETE 3+V*L*
3 series · 3 of 3 positions shown · non-contrast
Comparison: None.

CLINICAL DATA: Left ankle pain after twisting injury and fall
today.

EXAM:
LEFT ANKLE COMPLETE - 3+ VIEW

[ankle ap]
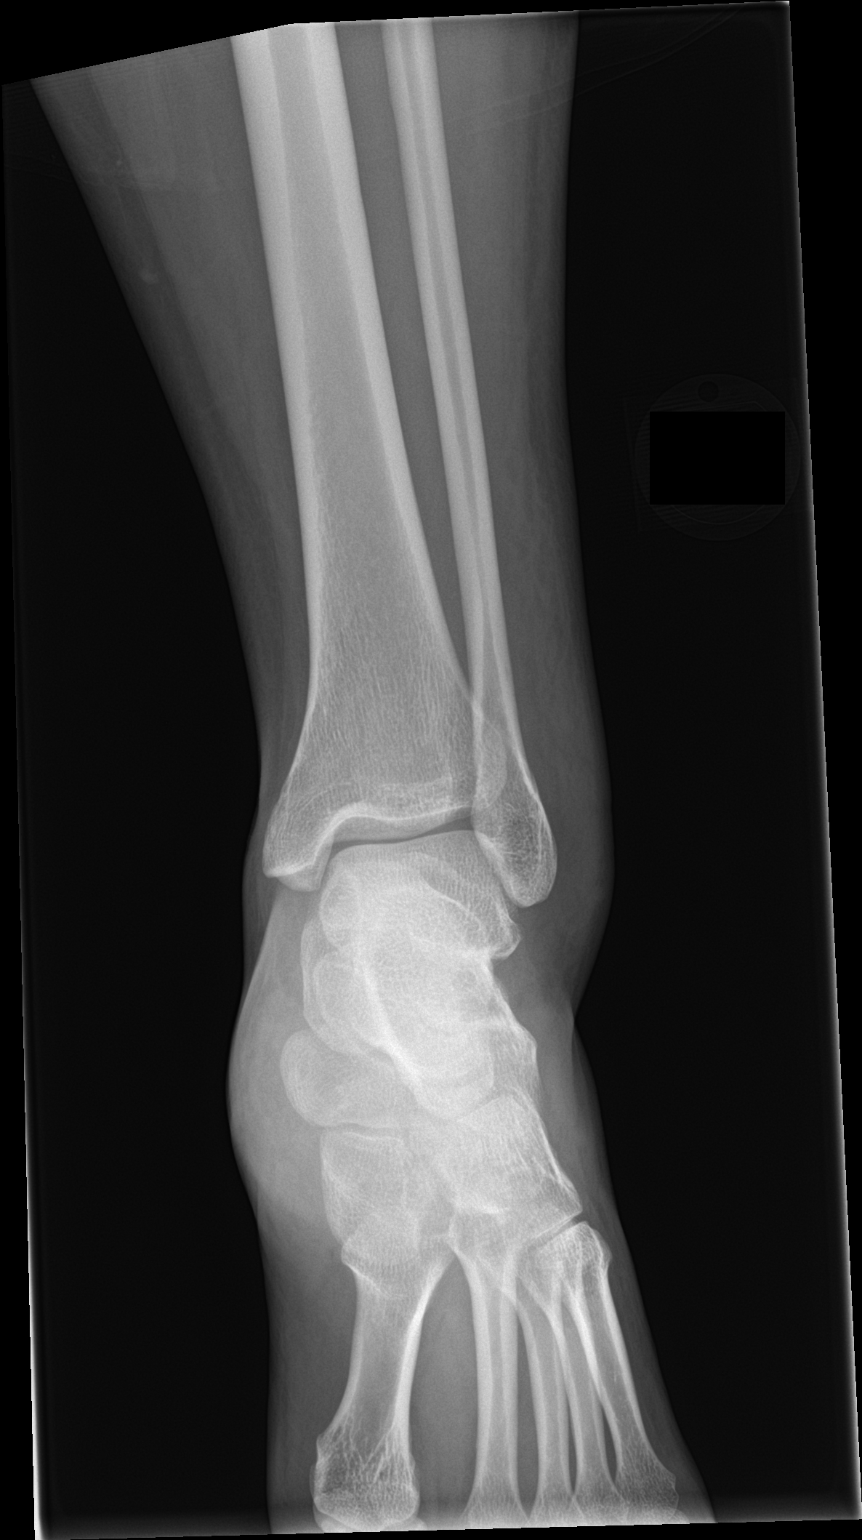

[ankle obl]
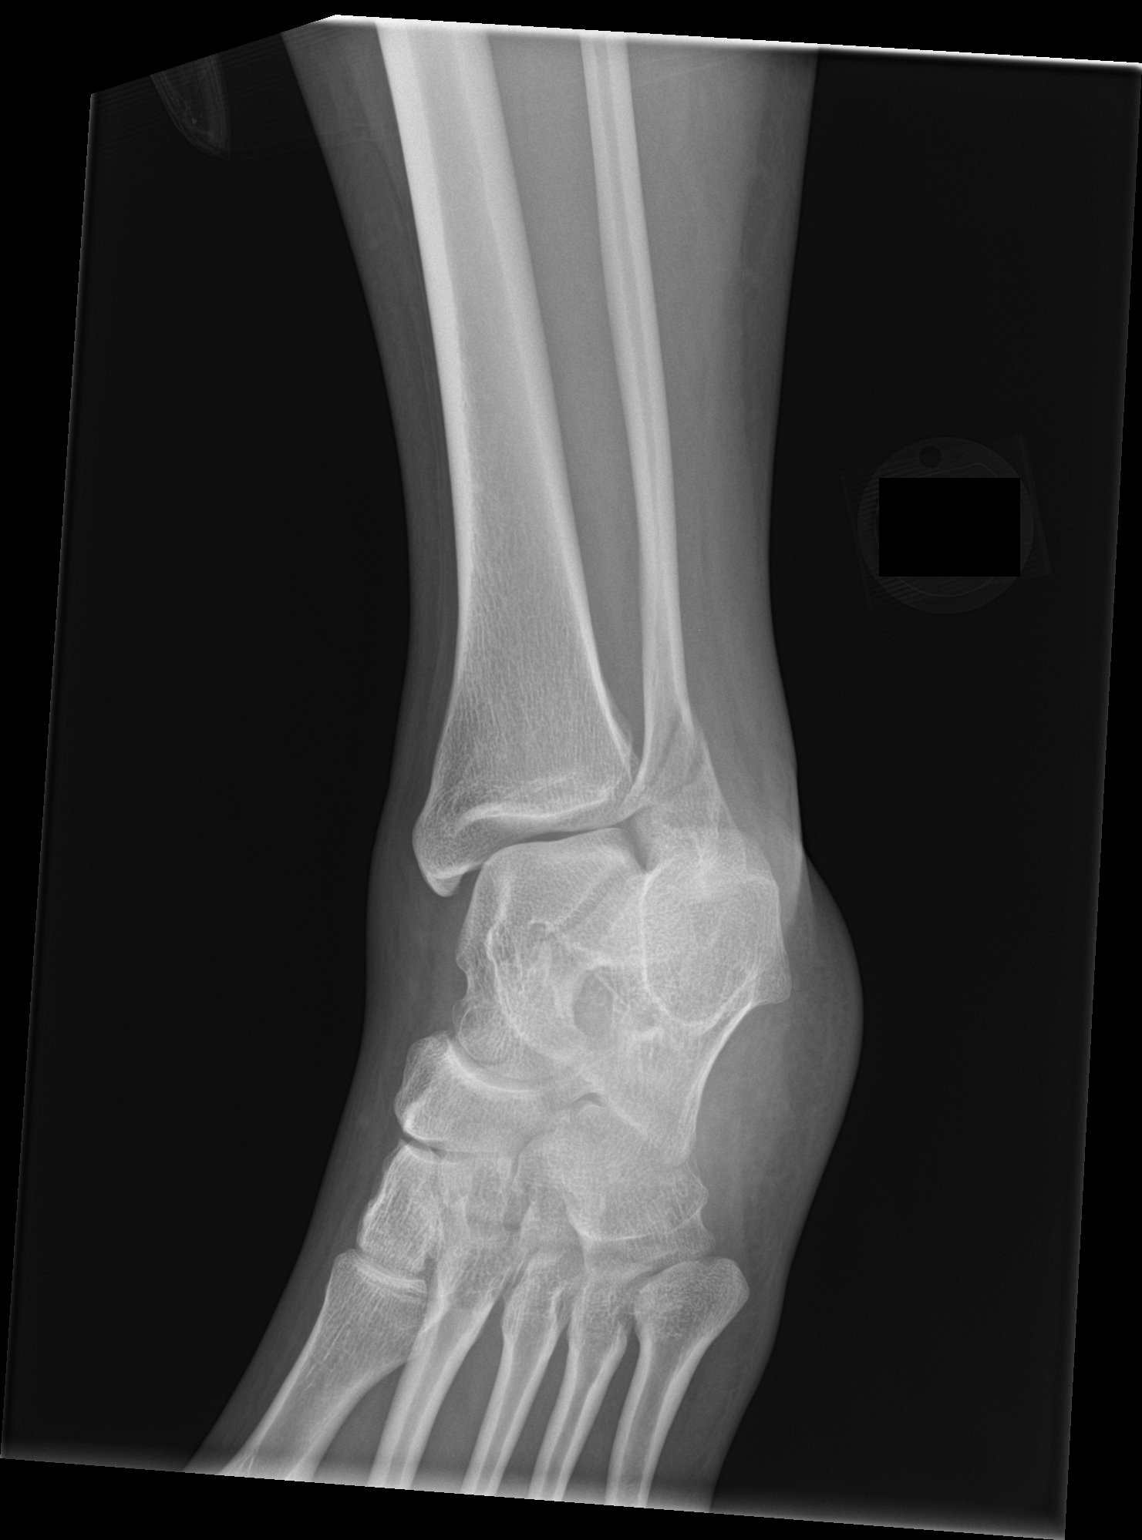

[ankle lat]
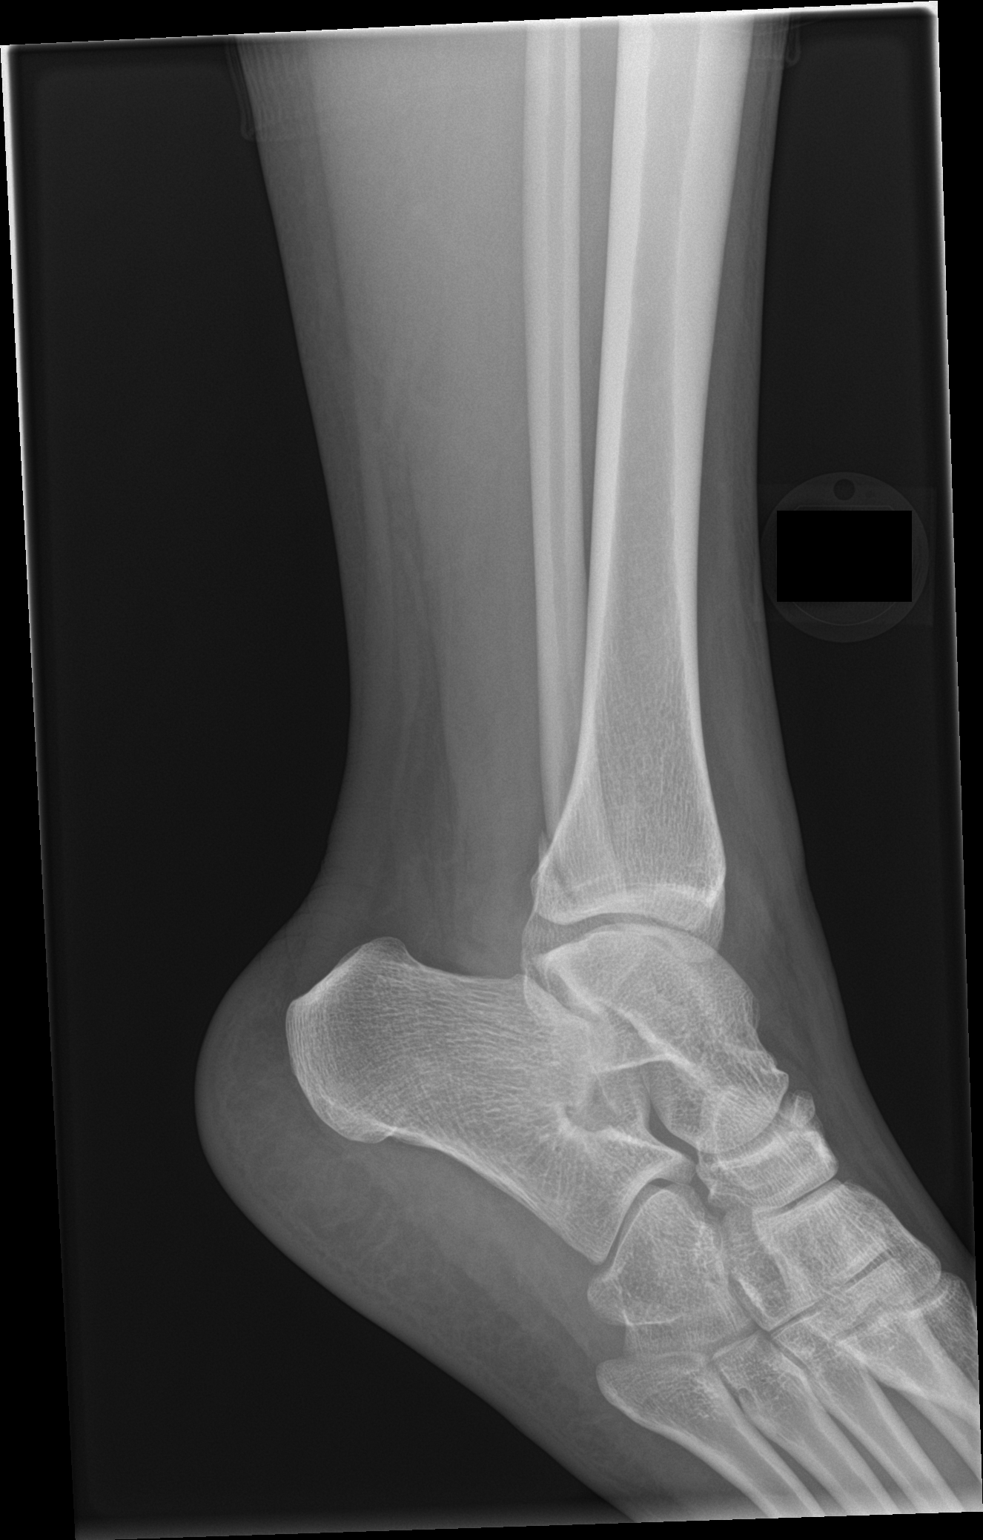

[3 of 3 positions shown; findings below may reference images not displayed]

FINDINGS: Oblique fracture of the distal fibula with mild posterior and
lateral displacement of the distal fracture fragments. Diffuse
lateral soft tissue swelling. No other fractures identified.
IMPRESSION: Oblique fracture of the distal fibula with mild displacement.

## 2020-06-14 ENCOUNTER — Emergency Department (HOSPITAL_COMMUNITY)
Admission: EM | Admit: 2020-06-14 | Discharge: 2020-06-15 | Disposition: A | Discharge status: Home / Self Care | Payer: Self-pay | Attending: Emergency Medicine | Admitting: Emergency Medicine

## 2020-06-14 ENCOUNTER — Encounter (HOSPITAL_COMMUNITY): Payer: Self-pay | Admitting: Emergency Medicine

## 2020-06-14 ENCOUNTER — Emergency Department (HOSPITAL_COMMUNITY): Payer: Self-pay

## 2020-06-14 ENCOUNTER — Other Ambulatory Visit: Payer: Self-pay

## 2020-06-14 DIAGNOSIS — R0602 Shortness of breath: Secondary | ICD-10-CM | POA: Insufficient documentation

## 2020-06-14 DIAGNOSIS — R0981 Nasal congestion: Secondary | ICD-10-CM | POA: Insufficient documentation

## 2020-06-14 DIAGNOSIS — R079 Chest pain, unspecified: Secondary | ICD-10-CM | POA: Insufficient documentation

## 2020-06-14 DIAGNOSIS — R519 Headache, unspecified: Secondary | ICD-10-CM | POA: Insufficient documentation

## 2020-06-14 DIAGNOSIS — Z5321 Procedure and treatment not carried out due to patient leaving prior to being seen by health care provider: Secondary | ICD-10-CM | POA: Insufficient documentation

## 2020-06-14 DIAGNOSIS — Z20822 Contact with and (suspected) exposure to covid-19: Secondary | ICD-10-CM | POA: Insufficient documentation

## 2020-06-14 LAB — CBC
HCT: 35.4 % — ABNORMAL LOW (ref 36.0–46.0)
Hemoglobin: 11.8 g/dL — ABNORMAL LOW (ref 12.0–15.0)
MCH: 31.6 pg (ref 26.0–34.0)
MCHC: 33.3 g/dL (ref 30.0–36.0)
MCV: 94.9 fL (ref 80.0–100.0)
Platelets: 276 10*3/uL (ref 150–400)
RBC: 3.73 MIL/uL — ABNORMAL LOW (ref 3.87–5.11)
RDW: 13.3 % (ref 11.5–15.5)
WBC: 10.1 10*3/uL (ref 4.0–10.5)
nRBC: 0 % (ref 0.0–0.2)

## 2020-06-14 LAB — RESP PANEL BY RT-PCR (FLU A&B, COVID) ARPGX2
Influenza A by PCR: NEGATIVE
Influenza B by PCR: NEGATIVE
SARS Coronavirus 2 by RT PCR: NEGATIVE

## 2020-06-14 LAB — BASIC METABOLIC PANEL
Anion gap: 11 (ref 5–15)
BUN: 5 mg/dL — ABNORMAL LOW (ref 6–20)
CO2: 20 mmol/L — ABNORMAL LOW (ref 22–32)
Calcium: 9.3 mg/dL (ref 8.9–10.3)
Chloride: 106 mmol/L (ref 98–111)
Creatinine, Ser: 0.46 mg/dL (ref 0.44–1.00)
GFR, Estimated: >60 mL/min (ref >60)
Glucose, Bld: 90 mg/dL (ref 70–99)
Potassium: 3.6 mmol/L (ref 3.5–5.1)
Sodium: 137 mmol/L (ref 135–145)

## 2020-06-14 LAB — TROPONIN I (HIGH SENSITIVITY)
Troponin I (High Sensitivity): <2 ng/L (ref <18)
Troponin I (High Sensitivity): 2 ng/L (ref <18)

## 2020-06-14 NOTE — ED Triage Notes (Signed)
Pt c/o headache, chest pain, shortness of breath and nasal congestion x 4 days.

## 2020-06-15 NOTE — ED Notes (Signed)
LWBS 

## 2021-04-16 LAB — HEPATITIS C ANTIBODY: HCV Ab: NEGATIVE

## 2021-04-16 LAB — OB RESULTS CONSOLE HIV ANTIBODY (ROUTINE TESTING): HIV: NONREACTIVE

## 2021-04-16 LAB — OB RESULTS CONSOLE GC/CHLAMYDIA
Chlamydia: NEGATIVE
Gonorrhea: NEGATIVE

## 2021-04-16 LAB — GLUCOSE, 1 HOUR GESTATIONAL: Glucose, 1 Hour GTT: 116

## 2021-04-16 LAB — OB RESULTS CONSOLE RPR: RPR: NONREACTIVE

## 2021-04-16 LAB — OB RESULTS CONSOLE HGB/HCT, BLOOD
HCT: 30 (ref 29–41)
Hemoglobin: 9.8

## 2021-04-16 LAB — OB RESULTS CONSOLE PLATELET COUNT: Platelets: 228

## 2021-04-16 LAB — OB RESULTS CONSOLE VARICELLA ZOSTER ANTIBODY, IGG: Varicella: IMMUNE

## 2021-04-16 LAB — OB RESULTS CONSOLE ABO/RH: RH Type: POSITIVE

## 2021-04-16 LAB — OB RESULTS CONSOLE ANTIBODY SCREEN: Antibody Screen: NEGATIVE

## 2021-04-16 LAB — OB RESULTS CONSOLE RUBELLA ANTIBODY, IGM: Rubella: NON-IMMUNE/NOT IMMUNE

## 2021-04-16 LAB — OB RESULTS CONSOLE HEPATITIS B SURFACE ANTIGEN: Hepatitis B Surface Ag: NEGATIVE

## 2021-05-17 ENCOUNTER — Other Ambulatory Visit: Payer: Self-pay | Admitting: Family

## 2021-05-17 ENCOUNTER — Other Ambulatory Visit (HOSPITAL_COMMUNITY): Payer: Self-pay | Admitting: Family

## 2021-06-03 NOTE — L&D Delivery Note (Addendum)
OB/GYN Faculty Practice Delivery Note ? ?Catherine Miles is a 32 y.o. [id number] s/p SVD at [redacted]w[redacted]d. She was admitted for IOL for fetal arrhythmia (suspected PACs).  ? ?ROM: 7h 61m with clear fluid ?GBS Status: Negative ? ?Delivery Date/Time: 08/14/2021 18:47 ? ?Delivery: Called to room and patient was complete and pushing. Head delivered OA without difficulty. Tight nuchal cord present, delivered through. Shoulder and body delivered in usual fashion. Cord easily reduced with delivery. Infant with spontaneous cry, placed on mother's abdomen, dried and stimulated. Cord clamped x 2 after 1-minute delay and cut by FOB under direct supervision. Cord blood drawn. Placenta delivered spontaneously with gentle cord traction. Fundus firm with massage and Pitocin. Labia, perineum, vagina, and cervix were inspected with bilateral periurethral lacerations. The left periurethral laceration was repaired with 4-0 Monocryl. The right periurethral abrasion was superficial, hemostatic, and not repaired.  ? ?Placenta: Intact, 3-vessel cord, sent to L&D ?Complications: None ?Lacerations: Bilateral periurethral  ?EBL: 75 cc ?Analgesia: Epidural ? ?Infant: Vigorous BG  APGARs 7,9  Weight pending ? ?Catherine Majors, MD ?FM PGY-1 ?08/14/2021, 7:31 PM ? ?GME ATTESTATION:  ?I saw and evaluated the patient. I was gowned, gloved, and present for the entire delivery and management of the patient. I agree with the findings and the plan of care as documented in the resident?s note. I have made changes to documentation as necessary. ? ?Catherine Field, MD ?OB Fellow, Faculty Practice ?[place] ?08/14/2021 7:51 PM ? ? ? ?

## 2021-06-07 ENCOUNTER — Encounter: Payer: Self-pay | Admitting: *Deleted

## 2021-06-12 ENCOUNTER — Other Ambulatory Visit: Payer: Self-pay

## 2021-06-13 ENCOUNTER — Other Ambulatory Visit: Payer: Self-pay | Admitting: Family

## 2021-06-13 ENCOUNTER — Other Ambulatory Visit: Payer: Self-pay

## 2021-06-13 ENCOUNTER — Ambulatory Visit: Payer: Medicaid Other | Attending: Family

## 2021-06-13 DIAGNOSIS — Z3A22 22 weeks gestation of pregnancy: Secondary | ICD-10-CM

## 2021-06-13 DIAGNOSIS — Z363 Encounter for antenatal screening for malformations: Secondary | ICD-10-CM | POA: Diagnosis not present

## 2021-06-13 DIAGNOSIS — O09293 Supervision of pregnancy with other poor reproductive or obstetric history, third trimester: Secondary | ICD-10-CM

## 2021-06-13 DIAGNOSIS — Z3689 Encounter for other specified antenatal screening: Secondary | ICD-10-CM

## 2021-06-13 DIAGNOSIS — O0933 Supervision of pregnancy with insufficient antenatal care, third trimester: Secondary | ICD-10-CM | POA: Diagnosis not present

## 2021-06-14 ENCOUNTER — Encounter (HOSPITAL_COMMUNITY): Payer: Self-pay

## 2021-06-14 ENCOUNTER — Other Ambulatory Visit: Payer: Self-pay | Admitting: *Deleted

## 2021-06-14 ENCOUNTER — Other Ambulatory Visit: Payer: Self-pay

## 2021-06-14 ENCOUNTER — Emergency Department (HOSPITAL_COMMUNITY)
Admission: EM | Admit: 2021-06-14 | Discharge: 2021-06-15 | Disposition: A | Discharge status: Home / Self Care | Payer: Medicaid Other | Attending: Student | Admitting: Student

## 2021-06-14 DIAGNOSIS — O98513 Other viral diseases complicating pregnancy, third trimester: Secondary | ICD-10-CM | POA: Diagnosis present

## 2021-06-14 DIAGNOSIS — U071 COVID-19: Secondary | ICD-10-CM | POA: Insufficient documentation

## 2021-06-14 DIAGNOSIS — F17210 Nicotine dependence, cigarettes, uncomplicated: Secondary | ICD-10-CM | POA: Diagnosis not present

## 2021-06-14 DIAGNOSIS — O0933 Supervision of pregnancy with insufficient antenatal care, third trimester: Secondary | ICD-10-CM

## 2021-06-14 NOTE — ED Provider Triage Note (Signed)
Emergency Medicine Provider Triage Evaluation Note  Catherine Miles , a 31 y.o. female  was evaluated in triage.  Pt complains of fevers, body aches, cough.  Symptoms been going on for 1 day.  She has had multiple COVID exposures.  She did test positive for COVID at home, however wants to be retested here to make sure it is true.  She is around 30 to [redacted] weeks pregnant.  Review of Systems  Positive:  Negative:   Physical Exam  BP (!) 93/53 (BP Location: Left Arm)    Pulse (!) 111    Temp 97.9 F (36.6 C) (Oral)    Resp 18    Ht 4\' 11"  (1.499 m)    Wt 59.9 kg    LMP 01/04/2021    SpO2 100%    BMI 26.66 kg/m  Gen:   Awake, no distress   Resp:  Normal effort  MSK:   Moves extremities without difficulty  Other:  Lungs CTA  Medical Decision Making  Medically screening exam initiated at 6:57 PM.  Appropriate orders placed.  Chevette Cronkright was informed that the remainder of the evaluation will be completed by another provider, this initial triage assessment does not replace that evaluation, and the importance of remaining in the ED until their evaluation is complete.     Luanna Cole, PA-C 06/14/21 1859

## 2021-06-14 NOTE — ED Triage Notes (Signed)
Pt arrived POV from home c/o generalized body aches. Pt states she took an at home COVID test and it was positive.

## 2021-06-15 LAB — RESP PANEL BY RT-PCR (FLU A&B, COVID) ARPGX2
Influenza A by PCR: NEGATIVE
Influenza B by PCR: NEGATIVE
SARS Coronavirus 2 by RT PCR: POSITIVE — AB

## 2021-06-15 MED ORDER — ONDANSETRON 4 MG PO TBDP: 4.0000 mg | ORAL_TABLET | Freq: Three times a day (TID) | ORAL | 0 refills | Status: DC | PRN

## 2021-06-15 NOTE — ED Provider Notes (Signed)
[place] EMERGENCY DEPARTMENT Provider Note  CSN: [id number] Arrival date & time: 06/14/21 1807  Chief Complaint(s) Generalized Body Aches  HPI Catherine Miles is a 31 y.o. G2 P1 female currently 7 months pregnant who presents to the emergency department for evaluation of generalized body aches.  Patient states that she has had generalized myalgias, nausea and chills over the last 3 days.  She states that she has had multiple coworkers at [employer] who have tested positive for [place].  She states that her first ultrasound of this pregnancy was performed 2 days ago and she currently has not met her OB/GYN.  She is unsure when her follow-up appointment is.  She states that she feels adequate fetal movement and has had no abdominal pain vaginal bleeding or other concerning symptoms.  HPI  Past Medical History Past Medical History:  Diagnosis Date   Allergy    Patient Active Problem List   Diagnosis Date Noted   Closed fracture of distal end of left fibula with routine healing 06/11/2018   Adjustment disorder with mixed disturbance of emotions and conduct 01/23/2015   Tobacco use disorder 01/23/2015   Alcohol use disorder, moderate, dependence ([place]) 01/23/2015   Cannabis use disorder, severe, dependence ([place]) 01/23/2015   Home Medication(s) Prior to Admission medications   Medication Sig Start Date End Date Taking? Authorizing Provider  ondansetron (ZOFRAN-ODT) 4 MG disintegrating tablet Take 1 tablet (4 mg total) by mouth every 8 (eight) hours as needed for nausea or vomiting. 06/15/21  Yes Priest Lockridge, MD  amoxicillin-clavulanate (AUGMENTIN) 875-125 MG tablet Take 1 tablet by mouth 2 (two) times daily. 08/10/15   Lawyer, Harrell Gave, PA-C  erythromycin ophthalmic ointment Place a 1/2 inch ribbon of ointment into the lower eyelid. 08/20/15   Gloriann Loan, PA-C  ibuprofen (ADVIL,MOTRIN) 800 MG tablet Take 1 tablet (800 mg total) by mouth 3 (three) times daily. 04/08/18    Larene Pickett, PA-C  oxyCODONE-acetaminophen (PERCOCET) 5-325 MG tablet Take 1 tablet by mouth every 4 (four) hours as needed. 04/08/18   Larene Pickett, PA-C                                                                                                                                    Past Surgical History Past Surgical History:  Procedure Laterality Date   MANDIBLE SURGERY     Family History History reviewed. No pertinent family history.  Social History Social History   Tobacco Use   Smoking status: Every Day    Packs/day: 0.50    Types: Cigarettes   Smokeless tobacco: Never  Substance Use Topics   Alcohol use: Yes    Alcohol/week: 12.0 standard drinks    Types: 12 Shots of liquor per week    Comment: on weekends 6 or more drinks   Drug use: No   Allergies Patient has no known allergies.  Review of Systems Review of Systems  Constitutional:  Positive for chills and fatigue.  Musculoskeletal:  Positive for myalgias.   Physical Exam Vital Signs  I have reviewed the triage vital signs BP 110/65 (BP Location: Left Arm)    Pulse 85    Temp 98.3 F (36.8 C) (Oral)    Resp 18    Ht $R'4\' 11"'hY$  (1.499 m)    Wt 59.9 kg    LMP 01/04/2021    SpO2 100%    BMI 26.66 kg/m   Physical Exam Vitals and nursing note reviewed.  Constitutional:      General: She is not in acute distress.    Appearance: She is well-developed.  HENT:     Head: Normocephalic and atraumatic.  Eyes:     Conjunctiva/sclera: Conjunctivae normal.  Cardiovascular:     Rate and Rhythm: Normal rate and regular rhythm.     Heart sounds: No murmur heard. Pulmonary:     Effort: Pulmonary effort is normal. No respiratory distress.     Breath sounds: Normal breath sounds.  Abdominal:     Palpations: Abdomen is soft.     Tenderness: There is no abdominal tenderness.  Musculoskeletal:        General: No swelling.     Cervical back: Neck supple.  Skin:    General: Skin is warm and dry.     Capillary  Refill: Capillary refill takes less than 2 seconds.  Neurological:     Mental Status: She is alert.  Psychiatric:        Mood and Affect: Mood normal.    ED Results and Treatments Labs (all labs ordered are listed, but only abnormal results are displayed) Labs Reviewed  RESP PANEL BY RT-PCR (FLU A&B, COVID) ARPGX2 - Abnormal; Notable for the following components:      Result Value   SARS Coronavirus 2 by RT PCR POSITIVE (*)    All other components within normal limits                                                                                                                          Radiology No results found.  Pertinent labs & imaging results that were available during my care of the patient were reviewed by me and considered in my medical decision making (see MDM for details).  Medications Ordered in ED Medications - No data to display  Procedures Procedures  (including critical care time)  Medical Decision Making / ED Course   This patient presents to the ED for concern of myalgias, this involves an extensive number of treatment options, and is a complaint that carries with it a high risk of complications and morbidity.  The differential diagnosis includes viral URI, COVID-19, influenza  MDM: Patient seen emergency department for evaluation of generalized body aches.  Physical exam is unremarkable with a gravid uterus and palpable fetal activity.  Cardiopulmonary exam unremarkable.  Patient COVID-positive here in the emergency department.  Patient instructed to avoid NSAIDs in the setting of pregnancy and for her nausea she was prescribed Zofran.  She was instructed to call the OB/GYN clinic today and confirm her follow-up with an in person OB/GYN and notify them of her COVID status.  She is hemodynamically stable here in the emergency department  and safe for discharge.  Additional history obtained:  -External records from outside source obtained and reviewed including: Chart review including previous notes, labs, imaging, consultation notes   Lab Tests: -I ordered, reviewed, and interpreted labs.   The pertinent results include:   Labs Reviewed  RESP PANEL BY RT-PCR (FLU A&B, COVID) ARPGX2 - Abnormal; Notable for the following components:      Result Value   SARS Coronavirus 2 by RT PCR POSITIVE (*)    All other components within normal limits      EKG  EKG Interpretation  Date/Time:    Ventricular Rate:    PR Interval:    QRS Duration:   QT Interval:    QTC Calculation:   R Axis:     Text Interpretation:            Medicines ordered and prescription drug management: Meds ordered this encounter  Medications   ondansetron (ZOFRAN-ODT) 4 MG disintegrating tablet    Sig: Take 1 tablet (4 mg total) by mouth every 8 (eight) hours as needed for nausea or vomiting.    Dispense:  20 tablet    Refill:  0    -I have reviewed the patients home medicines and have made adjustments as needed  Critical interventions none     Social Determinants of Health:  Factors impacting patients care include: Patient's access to care now she has new insurance, extended discussion on the importance of outpatient OB/GYN follow-up   Reevaluation: After the interventions noted above, I reevaluated the patient and found that they have :stayed the same  Co morbidities that complicate the patient evaluation  Past Medical History:  Diagnosis Date   Allergy       Dispostion: dc     Final Clinical Impression(s) / ED Diagnoses Final diagnoses:  [id number] affecting pregnancy in third trimester     '@PCDICTATION'$ @    Teressa Lower, MD 06/15/21 1539

## 2021-07-12 ENCOUNTER — Ambulatory Visit: Payer: Medicaid Other

## 2021-07-16 ENCOUNTER — Other Ambulatory Visit: Payer: Self-pay | Admitting: *Deleted

## 2021-07-16 ENCOUNTER — Encounter: Payer: Self-pay | Admitting: *Deleted

## 2021-07-16 ENCOUNTER — Ambulatory Visit: Payer: Medicaid Other | Admitting: *Deleted

## 2021-07-16 ENCOUNTER — Ambulatory Visit: Payer: Medicaid Other | Attending: Obstetrics and Gynecology

## 2021-07-16 ENCOUNTER — Other Ambulatory Visit: Payer: Self-pay

## 2021-07-16 VITALS — BP 103/59 | HR 112

## 2021-07-16 DIAGNOSIS — O0933 Supervision of pregnancy with insufficient antenatal care, third trimester: Secondary | ICD-10-CM | POA: Diagnosis not present

## 2021-07-16 DIAGNOSIS — Z3689 Encounter for other specified antenatal screening: Secondary | ICD-10-CM

## 2021-07-16 DIAGNOSIS — O09293 Supervision of pregnancy with other poor reproductive or obstetric history, third trimester: Secondary | ICD-10-CM

## 2021-07-16 DIAGNOSIS — O99013 Anemia complicating pregnancy, third trimester: Secondary | ICD-10-CM | POA: Diagnosis not present

## 2021-07-16 DIAGNOSIS — Z3A33 33 weeks gestation of pregnancy: Secondary | ICD-10-CM | POA: Diagnosis not present

## 2021-07-19 ENCOUNTER — Telehealth: Payer: Medicaid Other

## 2021-07-26 ENCOUNTER — Telehealth (INDEPENDENT_AMBULATORY_CARE_PROVIDER_SITE_OTHER): Payer: Medicaid Other

## 2021-07-26 DIAGNOSIS — Z3A00 Weeks of gestation of pregnancy not specified: Secondary | ICD-10-CM

## 2021-07-26 DIAGNOSIS — O0930 Supervision of pregnancy with insufficient antenatal care, unspecified trimester: Secondary | ICD-10-CM

## 2021-07-26 DIAGNOSIS — O0990 Supervision of high risk pregnancy, unspecified, unspecified trimester: Secondary | ICD-10-CM | POA: Insufficient documentation

## 2021-07-26 MED ORDER — GOJJI WEIGHT SCALE MISC: 1.0000 | Freq: Once | 0 refills | Status: AC

## 2021-07-26 MED ORDER — BLOOD PRESSURE KIT DEVI: 1.0000 | Freq: Once | 0 refills | Status: AC

## 2021-07-26 NOTE — Progress Notes (Signed)
New OB Intake  I connected with  Catherine Miles on 07/26/21 at 11:15 AM EST by MyChart Video Visit and verified that I am speaking with the correct person using two identifiers. Nurse is located at [facility] and pt is located at place of work.  I discussed the limitations, risks, security and privacy concerns of performing an evaluation and management service by telephone and the availability of in person appointments. I also discussed with the patient that there may be a patient responsible charge related to this service. The patient expressed understanding and agreed to proceed.  I explained I am completing New OB Intake today. We discussed her EDD of 08/28/21 that is based on [place] at 29w 1d. Pt is G2/P1001. I reviewed her allergies, medications, Medical/Surgical/OB history, and appropriate screenings. This is a low risk pregnancy, complicated only by limited prenatal care; pt has had some prior visits with [facility]..  Patient Active Problem List   Diagnosis Date Noted   Supervision of high risk pregnancy, antepartum 07/26/2021   Closed fracture of distal end of left fibula with routine healing 06/11/2018   Adjustment disorder with mixed disturbance of emotions and conduct 01/23/2015   Concerns addressed today  Delivery Plans:  Plans to deliver at [facility].   MyChart/Babyscripts MyChart access verified. I explained pt will have some visits in office and some virtually. Babyscripts instructions given and order placed.  Blood Pressure Cuff  Blood pressure cuff ordered for patient to pick-up from [employer]. Explained after first prenatal appt pt will check weekly and document in 53.  Weight scale: Weight scale ordered for patient to pick up from [employer].   Anatomy US Completed with MFM.  Labs Initial prenatal labs completed at health department. Will need third trimester labs. Patient scheduled for 08/01/21 for 2 HR GTT with RPR, HIV, and  CBC. 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 Partner testing not completed. Pt to pick up partner kit from front office today or tomorrow.   COVID Vaccine Patient has not had COVID vaccine.   Is patient a CenteringPregnancy candidate? Not a candidate   Is patient a Mom+Baby Combined Care candidate? Not a candidate     Social Determinants of Health Food Insecurity: Patient denies food insecurity. Transportation: Patient denies transportation needs.  First visit review I reviewed new OB appt with pt. I explained she will have a provider visit with Arlina Robes, MD; encounter routed to appropriate provider. Explained that patient will be seen by pregnancy navigator following visit with provider.  Annabell Howells, RN 07/26/2021  1:39 PM

## 2021-08-01 ENCOUNTER — Other Ambulatory Visit: Payer: Medicaid Other

## 2021-08-01 ENCOUNTER — Other Ambulatory Visit: Payer: Self-pay

## 2021-08-02 ENCOUNTER — Other Ambulatory Visit (HOSPITAL_COMMUNITY)
Admission: RE | Admit: 2021-08-02 | Discharge: 2021-08-02 | Disposition: A | Discharge status: Home / Self Care | Payer: Medicaid Other | Source: Ambulatory Visit | Attending: Obstetrics and Gynecology | Admitting: Obstetrics and Gynecology

## 2021-08-02 ENCOUNTER — Encounter: Payer: Self-pay | Admitting: Obstetrics and Gynecology

## 2021-08-02 ENCOUNTER — Ambulatory Visit (INDEPENDENT_AMBULATORY_CARE_PROVIDER_SITE_OTHER): Payer: Medicaid Other | Admitting: Obstetrics and Gynecology

## 2021-08-02 ENCOUNTER — Other Ambulatory Visit: Payer: Self-pay | Admitting: General Practice

## 2021-08-02 VITALS — BP 91/59 | HR 101 | Wt 134.0 lb

## 2021-08-02 DIAGNOSIS — O0990 Supervision of high risk pregnancy, unspecified, unspecified trimester: Secondary | ICD-10-CM

## 2021-08-02 DIAGNOSIS — Z23 Encounter for immunization: Secondary | ICD-10-CM | POA: Diagnosis not present

## 2021-08-02 DIAGNOSIS — O09299 Supervision of pregnancy with other poor reproductive or obstetric history, unspecified trimester: Secondary | ICD-10-CM | POA: Insufficient documentation

## 2021-08-02 LAB — CBC
Hematocrit: 29.9 % — ABNORMAL LOW (ref 34.0–46.6)
Hemoglobin: 10.1 g/dL — ABNORMAL LOW (ref 11.1–15.9)
MCH: 31.7 pg (ref 26.6–33.0)
MCHC: 33.8 g/dL (ref 31.5–35.7)
MCV: 94 fL (ref 79–97)
Platelets: 220 10*3/uL (ref 150–450)
RBC: 3.19 x10E6/uL — ABNORMAL LOW (ref 3.77–5.28)
RDW: 13.3 % (ref 11.7–15.4)
WBC: 6.6 10*3/uL (ref 3.4–10.8)

## 2021-08-02 LAB — HIV ANTIBODY (ROUTINE TESTING W REFLEX): HIV Screen 4th Generation wRfx: NONREACTIVE

## 2021-08-02 LAB — RPR: RPR Ser Ql: NONREACTIVE

## 2021-08-02 NOTE — Progress Notes (Signed)
Subjective:  ?Catherine Miles is a 31 y.o. G2P1001 at [redacted]w[redacted]d being seen today for ongoing prenatal care. Transferred from the [facility] due to insurance issues.   She is currently monitored for the following issues for this low-risk pregnancy and has Adjustment disorder with mixed disturbance of emotions and conduct; Closed fracture of distal end of left fibula with routine healing; Supervision of high risk pregnancy, antepartum; and History of difficult shoulder delivery, currently pregnant on their problem list. ? ?Patient reports general discomforts of pregnancy.  Contractions: Irritability. Vag. Bleeding: None.  Movement: Present. Denies leaking of fluid.  ? ?The following portions of the patient's history were reviewed and updated as appropriate: allergies, current medications, past family history, past medical history, past social history, past surgical history and problem list. Problem list updated. ? ?Objective:  ? ?Vitals:  ? 08/02/21 1356  ?BP: (!) 91/59  ?Pulse: (!) 101  ?Weight: 134 lb (60.8 kg)  ? ? ?Fetal Status: Fetal Heart Rate (bpm): 142   Movement: Present    ? ?General:  Alert, oriented and cooperative. Patient is in no acute distress.  ?Skin: Skin is warm and dry. No rash noted.   ?Cardiovascular: Normal heart rate noted  ?Respiratory: Normal respiratory effort, no problems with respiration noted  ?Abdomen: Soft, gravid, appropriate for gestational age. Pain/Pressure: Absent     ?Pelvic:  Cervical exam performed        ?Extremities: Normal range of motion.  Edema: None  ?Mental Status: Normal mood and affect. Normal behavior. Normal judgment and thought content.  ? ?Urinalysis:     ? ?Assessment and Plan:  ?Pregnancy: G2P1001 at [redacted]w[redacted]d ? ?1. Supervision of high risk pregnancy, antepartum ?Labor precautions ?- GC/Chlamydia probe amp ([place])not at [facility] ?- Culture, beta strep (group b only) ? ?2. History of difficult shoulder delivery, currently pregnant ?Growth 36% 07/16/21 ?F/U on 08/13/21 ? ?Term  labor symptoms and general obstetric precautions including but not limited to vaginal bleeding, contractions, leaking of fluid and fetal movement were reviewed in detail with the patient. ?Please refer to After Visit Summary for other counseling recommendations.  ?Return in about 1 week (around 08/09/2021) for OB visit, face to face, any provider. ? ? ?[name], MD ?

## 2021-08-02 NOTE — Patient Instructions (Signed)
Vaginal Delivery ?Vaginal delivery means that you give birth by pushing your baby out of your birth canal (vagina). Your health care team will help you before, during, and after vaginal delivery. ?Birth experiences are unique for every woman and every pregnancy, and birth experiences vary depending on where you choose to give birth. ?What are the risks and benefits? ?Generally, this is safe. However, problems may occur, including: ?Bleeding. ?Infection. ?Damage to other structures such as vaginal tearing. ?Allergic reactions to medicines. ?Despite the risks, benefits of vaginal delivery include less risk of bleeding and infection and a shorter recovery time compared to a Cesarean delivery. Cesarean delivery, or C-section, is the surgical delivery of a baby. ?What happens when I arrive at the birth center or hospital? ?Once you are in labor and have been admitted into the hospital or birth center, your health care team may: ?Review your pregnancy history and any concerns that you have. ?Talk with you about your birth plan and discuss pain control options. ?Check your blood pressure, breathing, and heartbeat. ?Assess your baby's heartbeat. ?Monitor your uterus for contractions. ?Check whether your bag of water (amniotic sac) has broken (ruptured). ?Insert an IV into one of your veins. This may be used to give you fluids and medicines. ?Monitoring ?Your health care team may assess your contractions (uterine monitoring) and your baby's heart rate (fetal monitoring). You may need to be monitored: ?Often, but not continuously (intermittently). ?All the time or for long periods at a time (continuously). Continuous monitoring may be needed if: ?You are taking certain medicines, such as medicine to relieve pain or make your contractions stronger. ?You have pregnancy or labor complications. ?Monitoring may be done by: ?Placing a special stethoscope or a handheld monitoring device on your abdomen to check your baby's heartbeat  and to check for contractions. ?Placing monitors on your abdomen (external monitors) to record your baby's heartbeat and the frequency and length of contractions. ?Placing monitors inside your uterus through your vagina (internal monitors) to record your baby's heartbeat and the frequency, length, and strength of your contractions. Depending on the type of monitor, it may remain in your uterus or on your baby's head until birth. ?Telemetry. This is a type of continuous monitoring that can be done with external or internal monitors. Instead of having to stay in bed, you are able to move around. ?Physical exam ?Your health care team may perform frequent physical exams. This may include: ?Checking how and where your baby is positioned in your uterus. ?Checking your cervix to determine: ?Whether it is thinning out (effacing). ?Whether it is opening up (dilating). ?What happens during labor and delivery? ?Normal labor and delivery is divided into the following three stages: ?Stage 1 ?This is the longest stage of labor. ?Throughout this stage, you will feel contractions. Contractions generally feel mild, infrequent, and irregular at first. They get stronger, more frequent, and more regular as you move through this stage. You may have contractions about every 2-3 minutes. ?This stage ends when your cervix is completely dilated to 4 inches (10 cm) and completely effaced. ?Stage 2 ?This stage starts once your cervix is completely effaced and dilated and lasts until the delivery of your baby. ?This is the stage where you will feel an urge to push your baby out of your vagina. ?You may feel stretching and burning pain, especially when the widest part of your baby's head passes through the vaginal opening (crowning). ?Once your baby is delivered, the umbilical cord will be clamped and   cut. Timing of cutting the cord will depend on your wishes, your baby's health, and your health care provider's practices. ?Your baby will be  placed on your bare chest (skin-to-skin contact) in an upright position and covered with a warm blanket. If you are choosing to breastfeed, watch your baby for feeding cues, like rooting or sucking, and help the baby to your breast for his or her first feeding. ?Stage 3 ?This stage starts immediately after the birth of your baby and ends after you deliver the placenta. ?This stage may take anywhere from 5 to 30 minutes. ?After your baby has been delivered, you will feel contractions as your body expels the placenta. These contractions also help your uterus get smaller and reduce bleeding. ?What can I expect after labor and delivery? ?After labor is over, you and your baby will be assessed closely until you are ready to go home. Your health care team will teach you how to care for yourself and your baby. ?You and your baby may be encouraged to stay in the same room (rooming in) during your hospital stay. This will help promote early bonding and successful breastfeeding. ?Your uterus will be checked and massaged regularly (fundal massage). ?You may continue to receive fluids and medicines through an IV. ?You will have some soreness and pain in your abdomen, vagina, and the area of skin between your vaginal opening and your anus (perineum). ?If an incision was made near your vagina (episiotomy) or if you had some vaginal tearing during delivery, cold compresses may be placed on your episiotomy or your tear. This helps to reduce pain and swelling. ?It is normal to have vaginal bleeding after delivery. Wear a sanitary pad for vaginal bleeding and discharge. ?Summary ?Vaginal delivery means that you will give birth by pushing your baby out of your birth canal (vagina). ?Your health care team will monitor you and your baby throughout the stages of labor. ?After you deliver your baby, your health care team will continue to assess you and your baby to ensure you are both recovering as expected after delivery. ?This  information is not intended to replace advice given to you by your health care provider. Make sure you discuss any questions you have with your health care provider. ?Document Revised: 04/17/2020 Document Reviewed: 04/17/2020 ?Elsevier Patient Education ? 2022 [employer]vier [employer] ? ?

## 2021-08-03 LAB — GC/CHLAMYDIA PROBE AMP (~~LOC~~) NOT AT ARMC
Chlamydia: POSITIVE — AB
Comment: NEGATIVE
Comment: NORMAL
Neisseria Gonorrhea: NEGATIVE

## 2021-08-06 ENCOUNTER — Encounter: Payer: Self-pay | Admitting: Obstetrics and Gynecology

## 2021-08-06 ENCOUNTER — Telehealth: Payer: Self-pay | Admitting: *Deleted

## 2021-08-06 DIAGNOSIS — A749 Chlamydial infection, unspecified: Secondary | ICD-10-CM | POA: Insufficient documentation

## 2021-08-06 DIAGNOSIS — O98819 Other maternal infectious and parasitic diseases complicating pregnancy, unspecified trimester: Secondary | ICD-10-CM

## 2021-08-06 LAB — CULTURE, BETA STREP (GROUP B ONLY): Strep Gp B Culture: NEGATIVE

## 2021-08-06 MED ORDER — AZITHROMYCIN 500 MG PO TABS: 1000.0000 mg | ORAL_TABLET | Freq: Once | ORAL | 0 refills | Status: AC

## 2021-08-06 NOTE — Telephone Encounter (Signed)
Called patient and notified + for Chlamydia which is STD and both she and partner need to be treated. I explained I will send in RX for Azithromycin for her to her pharmacy. I also explained her partner can get tx from his MD or call [facility]. I advised no intimate contact/ intercourse until both treated and wait 2 weeks. I explained she will have a TOC in 3 weeks. She voices understanding. ?[facility] STD report completed.  ?Nancy Fetter ?

## 2021-08-06 NOTE — Telephone Encounter (Signed)
-----   Message from [name], MD sent at 08/06/2021  1:49 AM EST ----- ?Please treat pt's chlamydia as per protocol. ?Pt is aware. ? ?Thanks ?Catherine Miles  ?

## 2021-08-07 ENCOUNTER — Other Ambulatory Visit: Payer: Self-pay

## 2021-08-07 ENCOUNTER — Ambulatory Visit (INDEPENDENT_AMBULATORY_CARE_PROVIDER_SITE_OTHER): Payer: Medicaid Other | Admitting: Advanced Practice Midwife

## 2021-08-07 ENCOUNTER — Other Ambulatory Visit: Payer: Medicaid Other

## 2021-08-07 VITALS — BP 106/67 | HR 89 | Wt 139.0 lb

## 2021-08-07 DIAGNOSIS — F4325 Adjustment disorder with mixed disturbance of emotions and conduct: Secondary | ICD-10-CM

## 2021-08-07 DIAGNOSIS — O09293 Supervision of pregnancy with other poor reproductive or obstetric history, third trimester: Secondary | ICD-10-CM | POA: Diagnosis not present

## 2021-08-07 DIAGNOSIS — Z3A37 37 weeks gestation of pregnancy: Secondary | ICD-10-CM

## 2021-08-07 DIAGNOSIS — O0990 Supervision of high risk pregnancy, unspecified, unspecified trimester: Secondary | ICD-10-CM | POA: Diagnosis not present

## 2021-08-07 DIAGNOSIS — O98813 Other maternal infectious and parasitic diseases complicating pregnancy, third trimester: Secondary | ICD-10-CM | POA: Diagnosis not present

## 2021-08-07 DIAGNOSIS — A749 Chlamydial infection, unspecified: Secondary | ICD-10-CM

## 2021-08-07 NOTE — Progress Notes (Signed)
? ?  PRENATAL VISIT NOTE ? ?Subjective:  ?Catherine Miles is a 31 y.o. G2P1001 at [redacted]w[redacted]d being seen today for ongoing prenatal care.  She is currently monitored for the following issues for this low-risk pregnancy and has Adjustment disorder with mixed disturbance of emotions and conduct; Closed fracture of distal end of left fibula with routine healing; Supervision of high risk pregnancy, antepartum; History of difficult shoulder delivery, currently pregnant; and Chlamydia infection affecting pregnancy on their problem list. ? ?Patient reports occasional contractions.  Contractions: Not present. Vag. Bleeding: None.  Movement: Present. Denies leaking of fluid.  ? ?The following portions of the patient's history were reviewed and updated as appropriate: allergies, current medications, past family history, past medical history, past social history, past surgical history and problem list. Problem list updated. ? ?Objective:  ? ?Vitals:  ? 08/07/21 0831  ?BP: 106/67  ?Pulse: 89  ?Weight: 139 lb (63 kg)  ? ? ?Fetal Status: Fetal Heart Rate (bpm): 125 Fundal Height: 37 cm Movement: Present  Presentation: Vertex ? ?General:  Alert, oriented and cooperative. Patient is in no acute distress.  ?Skin: Skin is warm and dry. No rash noted.   ?Cardiovascular: Normal heart rate noted  ?Respiratory: Normal respiratory effort, no problems with respiration noted  ?Abdomen: Soft, gravid, appropriate for gestational age.  Pain/Pressure: Present     ?Pelvic: Cervical exam performed Dilation: 1 Effacement (%): Thick Station: -3  ?Extremities: Normal range of motion.  Edema: Trace  ?Mental Status: Normal mood and affect. Normal behavior. Normal judgment and thought content.  ? ?Assessment and Plan:  ?Pregnancy: G2P1001 at [redacted]w[redacted]d ? ?1. Supervision of high risk pregnancy, antepartum ?- Patient scheduled for 2 hour GTT today but had normal 1 hour per [facility] records ?- GTT not completed ?- Desires elective IOL at 39 weeks, orders placed for  Cytotec + foley 03/22 midnight ? ?2. Adjustment disorder with mixed disturbance of emotions and conduct ?- Stable, plan for LCSW check-in postpartum ? ?3. Chlamydia infection affecting pregnancy in third trimester ?- Treated 08/06/2021 ? ?4. Hx of shoulder dystocia in prior pregnancy, currently pregnant, third trimester ?- 2012 at 40 weeks, birth weight 7lb 6oz ?- FH appropriate for GA ?- EFW 36% (2241 g) at 33w 6d ?- Repeat scan scheduled for 03/13 ? ?5. [redacted] weeks gestation of pregnancy ? ? ?Term labor symptoms and general obstetric precautions including but not limited to vaginal bleeding, contractions, leaking of fluid and fetal movement were reviewed in detail with the patient. ?Please refer to After Visit Summary for other counseling recommendations.  ?Return in about 1 week (around 08/14/2021) for MD or APP. ? ?Future Appointments  ?Date Time Provider Department Center  ?08/13/2021 11:00 AM [facility] NURSE [facility]  ?08/13/2021 11:15 AM [facility] US2 [facility]  ?08/15/2021  8:15 AM Allayne Stack, DO [facility]  ?08/22/2021 12:00 AM [facility] SCHED ROOM [facility] None  ?08/22/2021  9:25 AM Venora Maples, MD [facility]  ?08/29/2021  8:55 AM Tereso Newcomer, MD [facility]  ?09/05/2021 10:15 AM Anyanwu, Jethro Bastos, MD [facility]  ? ? ?Calvert Cantor, CNM ? ?

## 2021-08-07 NOTE — Progress Notes (Signed)
Patient reports having strong contractions that were "back to back". She stated that her contractions last night were roughly "5-10 minutes" apart and each contraction lasted about a minute. ? ?Patient denies any vaginal bleeding/discharge and stated that baby is moving well. ? ? ?Rommie Dunn, CMA ? ?08/07/21 ?

## 2021-08-13 ENCOUNTER — Ambulatory Visit (HOSPITAL_BASED_OUTPATIENT_CLINIC_OR_DEPARTMENT_OTHER): Payer: Medicaid Other | Admitting: Obstetrics

## 2021-08-13 ENCOUNTER — Ambulatory Visit: Payer: Medicaid Other | Admitting: *Deleted

## 2021-08-13 ENCOUNTER — Ambulatory Visit: Payer: Medicaid Other | Attending: Obstetrics | Admitting: Obstetrics

## 2021-08-13 ENCOUNTER — Other Ambulatory Visit: Payer: Self-pay

## 2021-08-13 ENCOUNTER — Inpatient Hospital Stay (HOSPITAL_COMMUNITY)
Admission: AD | Admit: 2021-08-13 | Discharge: 2021-08-16 | DRG: 807 | Disposition: A | Discharge status: Home / Self Care | Payer: Medicaid Other | Attending: Family Medicine | Admitting: Family Medicine

## 2021-08-13 ENCOUNTER — Encounter (HOSPITAL_COMMUNITY): Payer: Self-pay | Admitting: Family Medicine

## 2021-08-13 ENCOUNTER — Ambulatory Visit (HOSPITAL_BASED_OUTPATIENT_CLINIC_OR_DEPARTMENT_OTHER): Payer: Medicaid Other

## 2021-08-13 VITALS — BP 99/57 | HR 96

## 2021-08-13 DIAGNOSIS — O99013 Anemia complicating pregnancy, third trimester: Secondary | ICD-10-CM | POA: Diagnosis not present

## 2021-08-13 DIAGNOSIS — D649 Anemia, unspecified: Secondary | ICD-10-CM

## 2021-08-13 DIAGNOSIS — Z3490 Encounter for supervision of normal pregnancy, unspecified, unspecified trimester: Secondary | ICD-10-CM | POA: Diagnosis present

## 2021-08-13 DIAGNOSIS — O0933 Supervision of pregnancy with insufficient antenatal care, third trimester: Secondary | ICD-10-CM

## 2021-08-13 DIAGNOSIS — O285 Abnormal chromosomal and genetic finding on antenatal screening of mother: Secondary | ICD-10-CM

## 2021-08-13 DIAGNOSIS — O09299 Supervision of pregnancy with other poor reproductive or obstetric history, unspecified trimester: Secondary | ICD-10-CM

## 2021-08-13 DIAGNOSIS — O368330 Maternal care for abnormalities of the fetal heart rate or rhythm, third trimester, not applicable or unspecified: Secondary | ICD-10-CM | POA: Diagnosis not present

## 2021-08-13 DIAGNOSIS — O0990 Supervision of high risk pregnancy, unspecified, unspecified trimester: Secondary | ICD-10-CM | POA: Insufficient documentation

## 2021-08-13 DIAGNOSIS — Z3A38 38 weeks gestation of pregnancy: Secondary | ICD-10-CM | POA: Diagnosis not present

## 2021-08-13 DIAGNOSIS — O09293 Supervision of pregnancy with other poor reproductive or obstetric history, third trimester: Secondary | ICD-10-CM | POA: Insufficient documentation

## 2021-08-13 DIAGNOSIS — Z3A37 37 weeks gestation of pregnancy: Secondary | ICD-10-CM

## 2021-08-13 DIAGNOSIS — O368390 Maternal care for abnormalities of the fetal heart rate or rhythm, unspecified trimester, not applicable or unspecified: Secondary | ICD-10-CM

## 2021-08-13 DIAGNOSIS — Z23 Encounter for immunization: Secondary | ICD-10-CM | POA: Diagnosis not present

## 2021-08-13 DIAGNOSIS — Z87891 Personal history of nicotine dependence: Secondary | ICD-10-CM

## 2021-08-13 DIAGNOSIS — Z148 Genetic carrier of other disease: Secondary | ICD-10-CM

## 2021-08-13 LAB — TYPE AND SCREEN
ABO/RH(D): O POS
Antibody Screen: NEGATIVE

## 2021-08-13 LAB — CBC
HCT: 32.9 % — ABNORMAL LOW (ref 36.0–46.0)
Hemoglobin: 11.2 g/dL — ABNORMAL LOW (ref 12.0–15.0)
MCH: 32.1 pg (ref 26.0–34.0)
MCHC: 34 g/dL (ref 30.0–36.0)
MCV: 94.3 fL (ref 80.0–100.0)
Platelets: 233 10*3/uL (ref 150–400)
RBC: 3.49 MIL/uL — ABNORMAL LOW (ref 3.87–5.11)
RDW: 14.5 % (ref 11.5–15.5)
WBC: 9.3 10*3/uL (ref 4.0–10.5)
nRBC: 0 % (ref 0.0–0.2)

## 2021-08-13 MED ORDER — OXYTOCIN-SODIUM CHLORIDE 30-0.9 UT/500ML-% IV SOLN
1.0000 m[IU]/min | INTRAVENOUS | Status: DC
  Administered 2021-08-13: 2 m[IU]/min via INTRAVENOUS
  Filled 2021-08-13: qty 500

## 2021-08-13 MED ORDER — PHENYLEPHRINE 40 MCG/ML (10ML) SYRINGE FOR IV PUSH (FOR BLOOD PRESSURE SUPPORT): 80.0000 ug | PREFILLED_SYRINGE | INTRAVENOUS | Status: DC | PRN

## 2021-08-13 MED ORDER — SOD CITRATE-CITRIC ACID 500-334 MG/5ML PO SOLN: 30.0000 mL | ORAL | Status: DC | PRN

## 2021-08-13 MED ORDER — LIDOCAINE HCL (PF) 1 % IJ SOLN
30.0000 mL | INTRAMUSCULAR | Status: DC | PRN
Start: 2021-08-13 — End: 2021-08-14

## 2021-08-13 MED ORDER — OXYTOCIN-SODIUM CHLORIDE 30-0.9 UT/500ML-% IV SOLN
2.5000 [IU]/h | INTRAVENOUS | Status: DC
  Filled 2021-08-13: qty 500

## 2021-08-13 MED ORDER — PHENYLEPHRINE 40 MCG/ML (10ML) SYRINGE FOR IV PUSH (FOR BLOOD PRESSURE SUPPORT)
80.0000 ug | PREFILLED_SYRINGE | INTRAVENOUS | Status: DC | PRN
  Administered 2021-08-14 (×2): 80 ug via INTRAVENOUS
  Filled 2021-08-13: qty 10

## 2021-08-13 MED ORDER — ACETAMINOPHEN 325 MG PO TABS: 650.0000 mg | ORAL_TABLET | ORAL | Status: DC | PRN

## 2021-08-13 MED ORDER — TERBUTALINE SULFATE 1 MG/ML IJ SOLN: 0.2500 mg | Freq: Once | INTRAMUSCULAR | Status: DC | PRN

## 2021-08-13 MED ORDER — FENTANYL-BUPIVACAINE-NACL 0.5-0.125-0.9 MG/250ML-% EP SOLN
12.0000 mL/h | EPIDURAL | Status: DC | PRN
  Administered 2021-08-14: 10 mL/h via EPIDURAL
  Filled 2021-08-13: qty 250

## 2021-08-13 MED ORDER — LACTATED RINGERS IV SOLN
500.0000 mL | INTRAVENOUS | Status: DC | PRN
  Administered 2021-08-14: 500 mL via INTRAVENOUS

## 2021-08-13 MED ORDER — LACTATED RINGERS IV SOLN: 500.0000 mL | Freq: Once | INTRAVENOUS | Status: DC

## 2021-08-13 MED ORDER — LACTATED RINGERS IV SOLN: INTRAVENOUS | Status: DC

## 2021-08-13 MED ORDER — EPHEDRINE 5 MG/ML INJ: 10.0000 mg | INTRAVENOUS | Status: DC | PRN

## 2021-08-13 MED ORDER — MISOPROSTOL 50MCG HALF TABLET: 50.0000 ug | ORAL_TABLET | ORAL | Status: DC | PRN

## 2021-08-13 MED ORDER — ONDANSETRON HCL 4 MG/2ML IJ SOLN: 4.0000 mg | Freq: Four times a day (QID) | INTRAMUSCULAR | Status: DC | PRN

## 2021-08-13 MED ORDER — DIPHENHYDRAMINE HCL 50 MG/ML IJ SOLN: 12.5000 mg | INTRAMUSCULAR | Status: DC | PRN

## 2021-08-13 MED ORDER — OXYTOCIN BOLUS FROM INFUSION
333.0000 mL | Freq: Once | INTRAVENOUS | Status: AC
  Administered 2021-08-14: 333 mL via INTRAVENOUS

## 2021-08-13 NOTE — Progress Notes (Signed)
MFM Note ? ?Catherine Miles was seen for a follow up exam as she presented late for prenatal care and as a fetal arrhythmia was noted during her last exam.  She denies any problems since her last exam and reports feeling fetal movements throughout the day. ? ?The overall EFW measured today was 6 pounds 15 ounces (44th percentile).  There was normal amniotic fluid with a total AFI of 22.7 cm. ? ?On today's exam, a fetal arrhythmia continues to be noted.  There is a skipped beat after every third beat.  The skipped beat occurs at regular intervals.  There were no signs of fetal hydrops noted today. ? ?The patient was advised that the skipped beat is most likely related to premature atrial contractions (PACs).  She was advised that PACs are usually benign and will generally resolve after birth.  As there were no signs of hydrops noted today, she was reassured that her baby most likely has not been affected by the arrhythmia. ? ?As the patient presented late for prenatal care, a cardiac defect has not been completely excluded. ?  ?As the fetal arrhythmia occurs in a regular pattern and continues to be noted throughout today's exam, at her current gestational age delivery is recommended.   ? ?She was advised that her baby will need to be evaluated after birth to determine if the arrhythmia is still present and if any treatment is necessary. ? ?The patient had a nonstress test following today's exam that showed possible subtle late decelerations.   ? ?Due to this finding and due to the fetal arrhythmia, she was advised that my recommendation is for her to go immediately to the hospital for delivery. ? ?The patient declined to go immediately to the hospital as she stated that she had to take care of her daughter first.  She would prefer to go to the hospital tomorrow morning. ? ?After discussing her case with the first attending, the patient was notified that there are a large number of inductions already scheduled for  tomorrow morning and her delivery may be delayed as a result of the large number scheduled induction's.   ? ?The increased risk of an adverse pregnancy outcome such as a fetal demise by waiting for delivery was discussed. ? ?After everything was discussed with the patient, she agreed to go to the hospital for induction this evening at between 5 to 6 PM.  Hopefully, she will present to the hospital at that time.   ? ?The patient and her partner stated that all of her their questions have been answered today.   ? ?They understand the risks that they are taking by not going to the hospital immediately following today's exam. ? ?A total of 30 minutes was spent counseling and coordinating the care for this patient.   ?

## 2021-08-13 NOTE — Procedures (Signed)
[name] ?[birth year] ?[redacted]w[redacted]d ? ?Fetus A Non-Stress Test Interpretation for 08/13/21 ? ?Indication:  fetal arrhythmia ? ?Fetal Heart Rate A ?Mode: External ?Baseline Rate (A): 135 bpm ?Variability: Moderate ?Accelerations: None ?Decelerations: Late ?Multiple birth?: No ? ?Uterine Activity ?Mode: Toco ?Contraction Frequency (min): 3-7 ?Contraction Duration (sec): 80-120 ?Contraction Quality: Mild ?Resting Tone Palpated: Relaxed ? ?Interpretation (Fetal Testing) ?Nonstress Test Interpretation: Non-reactive ?Overall Impression: Non-reassuring ?Comments: tracing reviewed by Dr. Parke Poisson ? ? ?

## 2021-08-13 NOTE — Progress Notes (Signed)
Dr. Annamaria Boots spoke with pt after reviewing NST  tracing and [facility]. She stated she will get her child care arranged then go between 5-6 pm this evening. Dr. Dione Plover informed of pt plan. Kick counts reviewed and potential risks of waiting given by Dr.Fang. Pt understood and agreed to plan to arrive at [facility] btwn 5-6pm. ?

## 2021-08-13 NOTE — H&P (Addendum)
?OBSTETRIC ADMISSION HISTORY AND PHYSICAL ? ?Catherine Miles is a 31 y.o. female G2P1001 with IUP at [redacted]w[redacted]d by [place] on 1/11 presenting for IOL for Non-reassuring NST, fetal arrhythmia. She reports +FMs, No LOF, no VB, no blurry vision, headaches or peripheral edema, and RUQ pain.  She plans on bottle feeding. She request Depo for birth control. Patient reports she was smoking cigarettes and drinking occasional alcoholic beverages prior to 04/2021 due to her late arrival to [facility]. ?She received her prenatal care at  [facility]   ? ?Dating: By Ultrasound on 1/11  --->  Estimated Date of Delivery: 08/28/21 ? ?Sono:   ? ?@[redacted]w[redacted]d , CWD, normal anatomy, cephalic presentation, Posterior placental lie, 2241g, 36% EFW ? ? ?Prenatal History/Complications:  ?Late to Prenatal care ?Fetal Arrhythmia ?History of Shoulder Dystocia ?Adjustment disorder ?Chlamydia infection during pregnancy, treated 08/06/21 ? ? ?Past Medical History: ?Past Medical History:  ?Diagnosis Date  ? Alcohol use disorder, moderate, dependence (HCC) 01/23/2015  ? Allergy   ? Cannabis use disorder, severe, dependence (HCC) 01/23/2015  ? Medical history non-contributory   ? Tobacco use disorder 01/23/2015  ? ? ?Past Surgical History: ?Past Surgical History:  ?Procedure Laterality Date  ? MANDIBLE SURGERY    ? ? ?Obstetrical History: ?OB History   ? ? Gravida  ?2  ? Para  ?1  ? Term  ?1  ? Preterm  ?0  ? AB  ?0  ? Living  ?1  ?  ? ? SAB  ?0  ? IAB  ?0  ? Ectopic  ?0  ? Multiple  ?0  ? Live Births  ?1  ?   ?  ?  ? ? ?Social History ?Social History  ? ?Socioeconomic History  ? Marital status: Single  ?  Spouse name: Not on file  ? Number of children: Not on file  ? Years of education: Not on file  ? Highest education level: Not on file  ?Occupational History  ? Not on file  ?Tobacco Use  ? Smoking status: Former  ?  Packs/day: 0.50  ?  Types: Cigarettes  ?  Quit date: 04/03/2021  ?  Years since quitting: 0.3  ? Smokeless tobacco: Never  ?Vaping Use  ? Vaping Use: Never used   ?Substance and Sexual Activity  ? Alcohol use: Not Currently  ?  Alcohol/week: 12.0 standard drinks  ?  Types: 12 Shots of liquor per week  ?  Comment: on weekends 6 or more drinks  ? Drug use: No  ? Sexual activity: Yes  ?  Birth control/protection: None  ?Other Topics Concern  ? Not on file  ?Social History Narrative  ? Not on file  ? ?Social Determinants of Health  ? ?Financial Resource Strain: Not on file  ?Food Insecurity: No Food Insecurity  ? Worried About 13/06/2020 in the Last Year: Never true  ? Ran Out of Food in the Last Year: Never true  ?Transportation Needs: No Transportation Needs  ? Lack of Transportation (Medical): No  ? Lack of Transportation (Non-Medical): No  ?Physical Activity: Not on file  ?Stress: Not on file  ?Social Connections: Not on file  ? ? ?Family History: ?Family History  ?Problem Relation Age of Onset  ? Cancer Maternal Aunt   ? ? ?Allergies: ?No Known Allergies ? ?Medications Prior to Admission  ?Medication Sig Dispense Refill Last Dose  ? Prenatal Vit-Fe Fumarate-FA (PRENATAL PO) Take by mouth.     ? ? ? ?Review of Systems  ? ?  All systems reviewed and negative except as stated in HPI ? ?Last menstrual period 01/04/2021. ?General appearance: alert, cooperative, and appears stated age ?Lungs: clear to auscultation bilaterally ?Heart: regular rate and rhythm ?Abdomen: soft, non-tender; bowel sounds normal ?Pelvic: 1 / thick / -3 ?Extremities: Homans sign is negative, no sign of DVT ?Presentation: cephalic ?Fetal monitoringBaseline: 140 bpm, Variability: Good {> 6 bpm), Accelerations: Reactive, and Decelerations: Absent Fetal Arrhythmia Noted ?Uterine activityFrequency: Every 5-10 minutes ?  ? ? ?Prenatal labs: ?ABO, Rh: O/Positive/-- (11/14 1101) ?Antibody: Negative (11/14 1101) ?Rubella: Nonimmune (11/14 1101) ?RPR: Non Reactive (03/01 0916)  ?HBsAg: Negative (11/14 1101)  ?HIV: Non Reactive (03/01 0916)  ?GBS: Negative/-- (03/02 1434)  ?2 hr Glucola Not performed ?1 hr  Glucola 116, normal ?Genetic screening  Increased carrier risk for SMA, Panorama Low Risk ?Anatomy US [redacted]w[redacted]d normal fetal anatomy ? ?Prenatal Transfer Tool  ?Maternal Diabetes: No ?Genetic Screening: Abnormal:  Results: Other: Increased Carrier risk for SMA ?Maternal Ultrasounds/Referrals: Normal ?Fetal Ultrasounds or other Referrals:  None ?Maternal Substance Abuse:  No ?Significant Maternal Medications:  None ?Significant Maternal Lab Results: Group B Strep negative ? ?No results found for this or any previous visit (from the past 24 hour(s)). ? ?Patient Active Problem List  ? Diagnosis Date Noted  ? Encounter for elective induction of labor 08/13/2021  ? Chlamydia infection affecting pregnancy 08/06/2021  ? History of difficult shoulder delivery, currently pregnant 08/02/2021  ? Supervision of high risk pregnancy, antepartum 07/26/2021  ? Closed fracture of distal end of left fibula with routine healing 06/11/2018  ? Adjustment disorder with mixed disturbance of emotions and conduct 01/23/2015  ? ? ?Assessment/Plan:  ?Catherine Miles is a 31 y.o. G2P1001 at [redacted]w[redacted]d here for IOL for fetal arrhythmia and non-reassuring NST.  ? ?#Labor: Presented as direct admit for IOL for fetal arrhythmia and non-reassuring NST ? - Foley placed at 1800 ? - Low Dose Pit started 1820 ?#Pain: Epidural ?#FWB: Cat 1, Fetal Arrhythmia noted ?#ID:  GBS Negative ?#MOF: Bottle ?#MOC:Depo ?#Circ:  NA ? ?#Fetal Arrhythmia: IOL per MFM. NICU to be notified at birth, consider EKG. ? ?#Adjustment Disorder: stable, LCSW check-in postpartum ? ?#Hx Shoulder Dystocia: FH appropriate for GA, EFW 36% (2241 g) at [redacted]w[redacted]d ? ?#Hx of Chlamydia: s/p treatment 08/06/21 ? ? ? ?Theresia Majors, MD  ?08/13/2021, 4:46 PM ? ? ? ?

## 2021-08-14 ENCOUNTER — Inpatient Hospital Stay (HOSPITAL_COMMUNITY): Payer: Medicaid Other | Admitting: Anesthesiology

## 2021-08-14 ENCOUNTER — Encounter (HOSPITAL_COMMUNITY): Payer: Self-pay | Admitting: Family Medicine

## 2021-08-14 DIAGNOSIS — O368390 Maternal care for abnormalities of the fetal heart rate or rhythm, unspecified trimester, not applicable or unspecified: Secondary | ICD-10-CM | POA: Diagnosis present

## 2021-08-14 DIAGNOSIS — O368330 Maternal care for abnormalities of the fetal heart rate or rhythm, third trimester, not applicable or unspecified: Secondary | ICD-10-CM

## 2021-08-14 DIAGNOSIS — Z3A38 38 weeks gestation of pregnancy: Secondary | ICD-10-CM

## 2021-08-14 LAB — RPR: RPR Ser Ql: NONREACTIVE

## 2021-08-14 MED ORDER — ONDANSETRON HCL 4 MG PO TABS
4.0000 mg | ORAL_TABLET | ORAL | Status: DC | PRN
Start: 2021-08-14 — End: 2021-08-16

## 2021-08-14 MED ORDER — MEASLES, MUMPS & RUBELLA VAC IJ SOLR
0.5000 mL | Freq: Once | INTRAMUSCULAR | Status: AC
  Administered 2021-08-16: 0.5 mL via SUBCUTANEOUS
  Filled 2021-08-14: qty 0.5

## 2021-08-14 MED ORDER — IBUPROFEN 600 MG PO TABS
600.0000 mg | ORAL_TABLET | Freq: Four times a day (QID) | ORAL | Status: DC
  Administered 2021-08-15 – 2021-08-16 (×7): 600 mg via ORAL
  Filled 2021-08-14 (×7): qty 1

## 2021-08-14 MED ORDER — SENNOSIDES-DOCUSATE SODIUM 8.6-50 MG PO TABS
2.0000 | ORAL_TABLET | Freq: Every day | ORAL | Status: DC
  Administered 2021-08-15 – 2021-08-16 (×2): 2 via ORAL
  Filled 2021-08-14 (×2): qty 2

## 2021-08-14 MED ORDER — DIBUCAINE (PERIANAL) 1 % EX OINT: 1.0000 "application " | TOPICAL_OINTMENT | CUTANEOUS | Status: DC | PRN

## 2021-08-14 MED ORDER — COCONUT OIL OIL
1.0000 | TOPICAL_OIL | Status: DC | PRN
Start: 2021-08-14 — End: 2021-08-16

## 2021-08-14 MED ORDER — ACETAMINOPHEN 325 MG PO TABS: 650.0000 mg | ORAL_TABLET | ORAL | Status: DC | PRN

## 2021-08-14 MED ORDER — LIDOCAINE HCL (PF) 1 % IJ SOLN
INTRAMUSCULAR | Status: DC | PRN
Start: 2021-08-14 — End: 2021-08-14
  Administered 2021-08-14 (×2): 4 mL via EPIDURAL

## 2021-08-14 MED ORDER — ONDANSETRON HCL 4 MG/2ML IJ SOLN: 4.0000 mg | INTRAMUSCULAR | Status: DC | PRN

## 2021-08-14 MED ORDER — WITCH HAZEL-GLYCERIN EX PADS: 1.0000 "application " | MEDICATED_PAD | CUTANEOUS | Status: DC | PRN

## 2021-08-14 MED ORDER — SIMETHICONE 80 MG PO CHEW: 80.0000 mg | CHEWABLE_TABLET | ORAL | Status: DC | PRN

## 2021-08-14 MED ORDER — LACTATED RINGERS AMNIOINFUSION: INTRAVENOUS | Status: DC

## 2021-08-14 MED ORDER — DIPHENHYDRAMINE HCL 25 MG PO CAPS: 25.0000 mg | ORAL_CAPSULE | Freq: Four times a day (QID) | ORAL | Status: DC | PRN

## 2021-08-14 MED ORDER — BENZOCAINE-MENTHOL 20-0.5 % EX AERO
1.0000 "application " | INHALATION_SPRAY | CUTANEOUS | Status: DC | PRN
  Administered 2021-08-14: 1 via TOPICAL
  Filled 2021-08-14: qty 56

## 2021-08-14 MED ORDER — PRENATAL MULTIVITAMIN CH
1.0000 | ORAL_TABLET | Freq: Every day | ORAL | Status: DC
  Administered 2021-08-15 – 2021-08-16 (×2): 1 via ORAL
  Filled 2021-08-14 (×3): qty 1

## 2021-08-14 MED ORDER — MEDROXYPROGESTERONE ACETATE 150 MG/ML IM SUSP
150.0000 mg | INTRAMUSCULAR | Status: AC | PRN
  Administered 2021-08-16: 150 mg via INTRAMUSCULAR
  Filled 2021-08-14: qty 1

## 2021-08-14 NOTE — Progress Notes (Signed)
Labor Progress Note [name] is a 31 y.o. G2P1001 at [redacted]w[redacted]d presented for IOL due to fetal arrhythmia.  ? ?S: Doing well, no concerns at this time.  ? ?O:  ?BP 104/63   Pulse 97   Temp 98.3 ?F (36.8 ?C) (Oral)   Resp 18   Ht 4\' 11"  (1.499 m)   Wt 61.7 kg   LMP 01/04/2021   SpO2 98%   BMI 27.49 kg/m?  ? ?EFM: Baseline 145 bpm, moderate variability, no accels, late decels  ?Toco: Every 2-3 minutes  ? ?CVE: Dilation: 6 ?Effacement (%): 90 ?Station: -1 ?Presentation: Vertex ?Exam by:: Dr. [email or web address] ? ?A&P: 31 y.o. G2P1001 [redacted]w[redacted]d  ? ?#Labor: Minimal change since last exam. IUPC placed without difficulty. Will assess contraction pattern, initial contractions appear to be adequate. Will continue to titrate Pitocin and reassess in 2-3 hours.  ?#Pain: Epidural  ?#FWB: Cat 2 due to late decels. Attempted FSE placement but poor tracing due to fetal arrhythmia. FHT capturing better on external monitor. FSE removed and external monitor replaced. Will continue IVF bolus and position changes. Will also give phenylephrine due to low BP and reassess.  ?#GBS negative ? ?[redacted]w[redacted]d, MD ?3:30 PM ? ?

## 2021-08-14 NOTE — Discharge Summary (Signed)
? ?  Postpartum Discharge Summary ? ?   ?Patient Name: Catherine Miles ?DOB: 13-May-1991 ?MRN: [id number] ? ?Date of admission: 08/13/2021 ?Delivery date:08/14/2021  ?Delivering provider: Genia Del  ?Date of discharge: 08/16/2021 ? ?Admitting diagnosis: Encounter for elective induction of labor [Z34.90] ?Intrauterine pregnancy: [redacted]w[redacted]d     ?Secondary diagnosis:  Principal Problem: ?  Vaginal delivery ?Active Problems: ?  Supervision of high risk pregnancy, antepartum ?  History of difficult shoulder delivery, currently pregnant ?  Encounter for elective induction of labor ?  Fetal arrhythmia affecting pregnancy, antepartum ?  Rubella non-immune status, antepartum ? ?Additional problems: None    ?Discharge diagnosis: Term Pregnancy Delivered                                              ?Post partum procedures: None ?Augmentation: Pitocin and IP Foley ?Complications: None ? ?Hospital course: Induction of Labor With Vaginal Delivery   ?31 y.o. yo G2P2002 at [redacted]w[redacted]d was admitted to the hospital 08/13/2021 for induction of labor.  Indication for induction:  Fetal arrhythmia .  Patient had an uncomplicated labor course and progressed to complete after foley balloon, Pitocin, and SROM. She had an uncomplicated vaginal delivery.  ?Membrane Rupture Time/Date: 11:35 AM ,08/14/2021   ?Delivery Method:Vaginal, Spontaneous  ?Episiotomy: None  ?Lacerations:  Periurethral  ?Details of delivery can be found in separate delivery note.  Patient had a routine postpartum course.  She is eating, drinking, ambulating, and voiding without issue.  Patient is discharged home 08/16/21. ? ?Newborn Data: ?Birth date:08/14/2021  ?Birth time:6:47 PM  ?Gender:Female  ?Living status:Living  ?Apgars:7 ,9  ?Weight:2880 g  ? ?Magnesium Sulfate received: No ?BMZ received: No ?Rhophylac: N/A ?MMR: Offered postpartum -- per documentation RN documented on 3/15 that it was not given due to being rubella immune and after personally reviewing [facility] records  from 12/22 the patient was documented as rubella NON-immune. Order written and RN asked to give prior to discharge.  ? ?T-DaP: Given prenatally ?Flu: No ?Transfusion: No ? ?Physical exam  ?Vitals:  ? 08/15/21 0953 08/15/21 1435 08/15/21 1959 08/16/21 0535  ?BP: (!) 91/53 (!) 94/57 (!) 102/59 97/60  ?Pulse: 74 81 73 (!) 53  ?Resp: $Remov'16 16  16  'eCBMEx$ ?Temp: 98.3 ?F (36.8 ?C) 98.1 ?F (36.7 ?C) 98 ?F (36.7 ?C) 97.7 ?F (36.5 ?C)  ?TempSrc: Oral Oral Oral Oral  ?SpO2: 100% 100%    ?Weight:      ?Height:      ? ?General: alert, cooperative, and no distress ?Lochia: appropriate ?Uterine Fundus: firm ?DVT Evaluation: No significant calf/ankle edema. No calf tenderness. ? ?Labs: ?Lab Results  ?Component Value Date  ? WBC 9.3 08/13/2021  ? HGB 11.2 (L) 08/13/2021  ? HCT 32.9 (L) 08/13/2021  ? MCV 94.3 08/13/2021  ? PLT 233 08/13/2021  ? ?CMP Latest Ref Rng & Units 06/14/2020  ?Glucose 70 - 99 mg/dL 90  ?BUN 6 - 20 mg/dL 5(L)  ?Creatinine 0.44 - 1.00 mg/dL 0.46  ?Sodium 135 - 145 mmol/L 137  ?Potassium 3.5 - 5.1 mmol/L 3.6  ?Chloride 98 - 111 mmol/L 106  ?CO2 22 - 32 mmol/L 20(L)  ?Calcium 8.9 - 10.3 mg/dL 9.3  ? ?Edinburgh Score: ?Edinburgh Postnatal Depression Scale Screening Tool 08/15/2021  ?I have been able to laugh and see the funny side of things. 0  ?I have looked forward with  enjoyment to things. 0  ?I have blamed myself unnecessarily when things went wrong. 1  ?I have been anxious or worried for no good reason. 0  ?I have felt scared or panicky for no good reason. 0  ?Things have been getting on top of me. 1  ?I have been so unhappy that I have had difficulty sleeping. 0  ?I have felt sad or miserable. 0  ?I have been so unhappy that I have been crying. 0  ?The thought of harming myself has occurred to me. 0  ?Edinburgh Postnatal Depression Scale Total 2  ? ? ? ?After visit meds:  ?Allergies as of 08/16/2021   ?No Known Allergies ?  ? ?  ?Medication List  ?  ? ?TAKE these medications   ? ?acetaminophen 500 MG tablet ?Commonly  known as: TYLENOL ?Take 2 tablets (1,000 mg total) by mouth every 4 (four) hours as needed (pain). ?  ?ibuprofen 600 MG tablet ?Commonly known as: ADVIL ?Take 1 tablet (600 mg total) by mouth every 6 (six) hours as needed. ?  ?PRENATAL PO ?Take by mouth. ?  ? ?  ? ? ? ?Discharge home in stable condition ?Infant Feeding: Bottle ?Infant Disposition: home with mother ?Discharge instruction: per After Visit Summary and Postpartum booklet. ?Activity: Advance as tolerated. Pelvic rest for 6 weeks.  ?Diet: routine diet ?Future Appointments: ?Future Appointments  ?Date Time Provider [place]  ?09/26/2021  2:15 PM Griffin Basil, MD [facility]  ? ?Follow up Visit: ? Follow-up Information   ? ? [facility] Follow up.   ?Specialty: Obstetrics and Gynecology ?Contact information: ?[address] ?[place] [phone] ?[phone] ? ?  ?  ? ?  ?  ? ?  ? ?Message sent to [facility] by Dr. Gwenlyn Perking on 08/14/21.  ? ?Please schedule this patient for a In person postpartum visit in 6 weeks with the following provider: Any provider. ?Additional Postpartum F/U:  None   ?High risk pregnancy complicated by: Hx of shoulder dystocia, fetal arrhythmia  ?Delivery mode:  Vaginal, Spontaneous  ?Anticipated Birth Control:  Depo ?Postpartum Vaccinations: MMR ? ? ?Caren Macadam, MD, MPH, ABFM, IBCLC ?Attending Physician ?[facility] [place] ? ?08/16/2021 ?Caren Macadam, MD ? ? ? ?

## 2021-08-14 NOTE — Anesthesia Procedure Notes (Signed)
Epidural ?Patient location during procedure: OB ?Start time: 08/14/2021 9:38 AM ?End time: 08/14/2021 9:52 AM ? ?Staffing ?Anesthesiologist: Heather Roberts, MD ?Performed: anesthesiologist  ? ?Preanesthetic Checklist ?Completed: patient identified, IV checked, site marked, risks and benefits discussed, monitors and equipment checked, pre-op evaluation and timeout performed ? ?Epidural ?Patient position: sitting ?Prep: DuraPrep ?Patient monitoring: heart rate, cardiac monitor, continuous pulse ox and blood pressure ?Approach: midline ?Location: L2-L3 ?Injection technique: LOR saline ? ?Needle:  ?Needle type: Tuohy  ?Needle gauge: 17 G ?Needle length: 9 cm ?Needle insertion depth: 5 cm ?Catheter size: 20 Guage ?Catheter at skin depth: 10 cm ?Test dose: negative and Other ? ?Assessment ?Events: blood not aspirated, injection not painful, no injection resistance and negative IV test ? ?Additional Notes ?Informed consent obtained prior to proceeding including risk of failure, 1% risk of PDPH, risk of minor discomfort and bruising.  Discussed rare but serious complications including epidural abscess, permanent nerve injury, epidural hematoma.  Discussed alternatives to epidural analgesia and patient desires to proceed.  Timeout performed pre-procedure verifying patient name, procedure, and platelet count.  Patient tolerated procedure well. ? ? ? ? ?

## 2021-08-14 NOTE — Progress Notes (Signed)
Called due to variable decelerations on FHT. Variable decels noted into the 90s with contractions. Amnioinfusion ordered. Will also decrease Pitocin to 10 milli-units/min to allow for fetal recovery and reassess. Plan of care discussed with Dr. Nehemiah Settle who was in agreement with plan of care.  ? ?Vilma Meckel, MD  ?

## 2021-08-14 NOTE — Progress Notes (Signed)
Labor Progress Note ?Catherine Miles is a 31 y.o. G2P1001 at [redacted]w[redacted]d presented for IOL due to fetal arrhythmia. ?  ?S: Feeling some contractions. FB just came out.  ? ?O:  ?BP 106/61   Pulse 85   Temp 98.3 ?F (36.8 ?C) (Oral)   Resp 16   Ht 4\' 11"  (1.499 m)   Wt 61.7 kg   LMP 01/04/2021   SpO2 99%   BMI 27.49 kg/m?  ?EFM: 145/mod/15x15/none ? ?CVE: Dilation: 6 ?Effacement (%): 70 ?Station: -2 ?Presentation: Vertex ?Exam by:: J Mbugua RN ? ? ?A&P: 31 y.o. G2P1001 [redacted]w[redacted]d  ?#Labor: Progressing well now s/p FB. Pit currently at 2. Discussed AROM with patient now, however she would like increase pit for now and reassess in a few hours.  ?#Pain: PRN, planning epidural  ?#FWB: Cat I  ?#GBS negative ? ?#Fetal Arrhythmia: FHT has been reassuring. NICU is aware.   ? ?[redacted]w[redacted]d, DO ?2:33 AM  ?

## 2021-08-14 NOTE — Anesthesia Preprocedure Evaluation (Signed)
Anesthesia Evaluation  ?Patient identified by MRN, date of birth, ID band ?Patient awake ? ? ? ?Reviewed: ?Allergy & Precautions, NPO status , Patient's Chart, lab work & pertinent test results ? ?Airway ?Mallampati: II ? ?TM Distance: >3 FB ? ? ? ? Dental ?no notable dental hx. ?(+) Dental Advisory Given ?  ?Pulmonary ?former smoker,  ?  ?Pulmonary exam normal ? ? ? ? ? ? ? Cardiovascular ?negative cardio ROS ?Normal cardiovascular exam ? ? ?  ?Neuro/Psych ?PSYCHIATRIC DISORDERS Anxiety negative neurological ROS ?   ? GI/Hepatic ?negative GI ROS, (+)  ?  ? substance abuse ? alcohol use and marijuana use,   ?Endo/Other  ?negative endocrine ROS ? Renal/GU ?negative Renal ROS  ?negative genitourinary ?  ?Musculoskeletal ?negative musculoskeletal ROS ?(+)  ? Abdominal ?  ?Peds ?negative pediatric ROS ?(+)  Hematology ? ?(+) Blood dyscrasia, anemia ,   ?Anesthesia Other Findings ? ? Reproductive/Obstetrics ?negative OB ROS ? ?  ? ? ? ? ? ? ? ? ? ? ? ? ? ?  ?  ? ? ? ? ? ? ? ? ?Anesthesia Physical ?Anesthesia Plan ? ?ASA: 2 ? ?Anesthesia Plan: Epidural  ? ?Post-op Pain Management:   ? ?Induction:  ? ?PONV Risk Score and Plan:  ? ?Airway Management Planned: Natural Airway ? ?Additional Equipment:  ? ?Intra-op Plan:  ? ?Post-operative Plan:  ? ?Informed Consent:  ? ?Plan Discussed with: Anesthesiologist ? ?Anesthesia Plan Comments:   ? ? ? ? ? ? ?Anesthesia Quick Evaluation ? ?

## 2021-08-14 NOTE — Progress Notes (Signed)
Labor Progress Note ?Catherine Miles is a 31 y.o. G2P1001 at [redacted]w[redacted]d presented for IOL due to fetal arrhythmia.  ? ?S: Doing well. More comfortable with epidural. No concerns at this time.  ? ?O:  ?BP 98/60 (BP Location: Left Arm)   Pulse 88   Temp 98.3 ?F (36.8 ?C) (Oral)   Resp 18   Ht 4\' 11"  (1.499 m)   Wt 61.7 kg   LMP 01/04/2021   SpO2 99%   BMI 27.49 kg/m?  ? ?EFM: Baseline 140 bpm, moderate variability, no accels, early/late decels  ? ?CVE: Dilation: 6 ?Effacement (%): 50 ?Station: -2 ?Presentation: Vertex ?Exam by:: [email or web address] RN ? ? ?A&P: 31 y.o. G2P1001 [redacted]w[redacted]d  ? ?#Labor: S/p SROM at 1135. Continues on Pitocin with contractions every 3 minutes. Will reassess in 2-3 hours from last exam, sooner as needed. Will continue to monitor.  ?#Pain: Epidural  ?#FWB: Cat 2 due to intermittent late decels. Continues to have reassuring variability. Will continue to monitor closely.  ?#GBS negative ? ?[redacted]w[redacted]d, MD ?1:30 PM ? ?

## 2021-08-15 ENCOUNTER — Encounter: Payer: Medicaid Other | Admitting: Family Medicine

## 2021-08-15 NOTE — Progress Notes (Signed)
PP day 1  Subjective      Catherine Miles is doing well. Pain is well controlled with po pain medication and topical perineal preparations. She denies severe pain, heavy bleeding, palpitations, or sob. Ambulating and voiding without difficulty. Method of Feeding: formula. Desires Depo-provera for contraception.   Objective   BP (!) 98/53 (BP Location: Right Arm)   Pulse 71   Temp 98.7 F (37.1 C) (Oral)   Resp 16   Ht 4\' 11"  (1.499 m)   Wt 61.7 kg   LMP 01/04/2021   SpO2 100%   Breastfeeding Unknown   BMI 27.49 kg/m    Physical Exam  Fundus: Firm, Below umbilicus Lochia: scant Perineum: intact, no edema present.  Breasts: non-engorged, nipples without cracks Extremities: no edema, no varices   Recent Labs    08/13/21 1717  HGB 11.2*  HCT 32.9*     Assessment  31 y.o. 08/15/21 postpartum day 1 status post vaginal delivery complicated by bilateral periurethral lacerations, repaired. Pregnancy course was complicated by fetal arrhythmia, previous history of difficult shoulder delivery, and chlamydia infection affecting pregnancy.  Plan Continue routine PP care Postpartum Teaching:   Discussed with patient appropriate postpartum nutrition, hydration, breastfeeding, activity, avoiding vaginal insertion until lochia complete, and typical postpartum course. Patient advised to call with fever greater than 101, excessive bleeding or passing large clots, uncontrolled pain, s/s of postpartum depression, or s/s of DVT.    Plan D/C Tomorrow    Electronically Signed by: [id number], SNM 08/15/21 7:40 AM

## 2021-08-15 NOTE — Anesthesia Postprocedure Evaluation (Signed)
Anesthesia Post Note ? ?Patient: Catherine Miles ? ?Procedure(s) Performed: AN AD HOC LABOR EPIDURAL ? ?  ? ?Patient location during evaluation: Mother Baby ?Anesthesia Type: Epidural ?Level of consciousness: awake and alert and oriented ?Pain management: satisfactory to patient ?Vital Signs Assessment: post-procedure vital signs reviewed and stable ?Respiratory status: respiratory function stable ?Cardiovascular status: stable ?Postop Assessment: no headache, no backache, epidural receding, patient able to bend at knees, no signs of nausea or vomiting, adequate PO intake and able to ambulate ?Anesthetic complications: no ? ? ?No notable events documented. ? ?Last Vitals:  ?Vitals:  ? 08/15/21 0602 08/15/21 0953  ?BP: (!) 98/53 (!) 91/53  ?Pulse: 71 74  ?Resp: 16 16  ?Temp: 37.1 ?C 36.8 ?C  ?SpO2:  100%  ?  ?Last Pain:  ?Vitals:  ? 08/15/21 1000  ?TempSrc:   ?PainSc: 0-No pain  ? ?Pain Goal: Patients Stated Pain Goal: 3 (08/15/21 1000) ? ?  ?  ?  ?  ?  ?  ?  ? ?Lionardo Haze ? ? ? ? ?

## 2021-08-15 NOTE — Social Work (Signed)
CSW consulted for "hx adjustment & mood diagnosis and occ alcohol use (reported last 04/2021)." ? ?CSW met with Catherine Miles to assess and provide support. CSW introduced self and role. CSW observed Catherine Miles in the process of making a pediatrician appointment and FOB performing skin to skin with infant 'Heavenly.' CSW offered to speak with Catherine Miles alone, however Catherine Miles declined and stated FOB could remain in the room. CSW informed Catherine Miles of the reason for consult and assessed current feelings. Catherine Miles was cheerful as she shared she is doing well. Catherine Miles stated she also had a good pregnancy. CSW inquired on Catherine Miles mental health history. 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 Catherine Miles reported that she had no mental health concerns recently or during the pregnancy. Catherine Miles stated she has never been on medications or attended counseling. When inquiring on supports. Catherine Miles identified FOB and her mother as her primary supports. Catherine Miles denies any current SI or HI. ? ?CSW asked Catherine Miles about alcohol and tobacco use. Catherine Miles disclosed that she did use alcohol during pregnancy, however it was prior to knowing she was pregnant. Catherine Miles also acknowledged tobacco use. CSW asked Catherine Miles if either are a problem and if she would like substance resources to quit. Catherine Miles denies having an issue with substance use and declined resources.  ? ?CSW reviewed postpartum depression versus the baby blues period and provided Catherine Miles with mental health resources. CSW also provided the New Mom Checklist and encouraged Catherine Miles to self evaluate. ?CSW provided education on Sudden Infant Death Syndrome (SIDS). Parents reported that infant will be sleeping in a crib. Catherine Miles has a car seat and all infant essentials. Catherine Miles identified [facility] for pediatric follow-up care and denies any transportation barriers. CSW offered community referrals for additional support, however Catherine Miles declined. Catherine Miles reported she has no stressors or questions at this time. ? ?CSW identifies no further need for  intervention and no barriers to discharge at this time. ? ?Dangela How, LCSW ?Clinical Social Work ?[facility] ?[phone] ? ?

## 2021-08-16 MED ORDER — MEASLES, MUMPS & RUBELLA VAC IJ SOLR: 0.5000 mL | Freq: Once | INTRAMUSCULAR | Status: DC

## 2021-08-16 MED ORDER — IBUPROFEN 600 MG PO TABS: 600.0000 mg | ORAL_TABLET | Freq: Four times a day (QID) | ORAL | 0 refills | Status: AC | PRN

## 2021-08-16 MED ORDER — ACETAMINOPHEN 500 MG PO TABS: 1000.0000 mg | ORAL_TABLET | ORAL | 0 refills | Status: AC | PRN

## 2021-08-22 ENCOUNTER — Inpatient Hospital Stay (HOSPITAL_COMMUNITY): Payer: Medicaid Other

## 2021-08-22 ENCOUNTER — Encounter: Payer: Self-pay | Admitting: Family Medicine

## 2021-08-22 ENCOUNTER — Inpatient Hospital Stay (HOSPITAL_COMMUNITY): Admission: AD | Admit: 2021-08-22 | Payer: Medicaid Other | Source: Home / Self Care | Admitting: Family Medicine

## 2021-08-22 DIAGNOSIS — A749 Chlamydial infection, unspecified: Secondary | ICD-10-CM | POA: Diagnosis present

## 2021-08-22 DIAGNOSIS — O09299 Supervision of pregnancy with other poor reproductive or obstetric history, unspecified trimester: Secondary | ICD-10-CM

## 2021-08-22 DIAGNOSIS — O0990 Supervision of high risk pregnancy, unspecified, unspecified trimester: Secondary | ICD-10-CM

## 2021-08-22 DIAGNOSIS — F4325 Adjustment disorder with mixed disturbance of emotions and conduct: Secondary | ICD-10-CM | POA: Diagnosis present

## 2021-08-25 ENCOUNTER — Telehealth (HOSPITAL_COMMUNITY): Payer: Self-pay

## 2021-08-25 NOTE — Telephone Encounter (Signed)
"  Doing fine." Patient declines questions or concerns about her healing.  ? ?"She's growing, doing fine. She's been to the doctor and everything is good. She sleeps in a crib." RN reviewed ABC's of safe sleep with patient. Patient declines any questions or concerns about baby. ? ?EPDS score is 4. ? ?Marcelino Duster Chetan Mehring,RN3,MSN,RNC-MNN ?08/25/2021,1304 ?

## 2021-08-29 ENCOUNTER — Encounter: Payer: Self-pay | Admitting: Obstetrics & Gynecology

## 2021-09-05 ENCOUNTER — Encounter: Payer: Self-pay | Admitting: Obstetrics & Gynecology

## 2021-09-26 ENCOUNTER — Ambulatory Visit: Payer: Medicaid Other | Admitting: Obstetrics and Gynecology

## 2021-10-17 ENCOUNTER — Ambulatory Visit: Payer: Medicaid Other | Admitting: Obstetrics and Gynecology

## 2022-05-25 IMAGING — US US MFM OB FOLLOW-UP
1 series · 12 of 28 positions shown · non-contrast
Comparison: none

[Series 1: us mfm ob follow-up · 40 acquisitions, 12 frames shown]
[im 2/40]
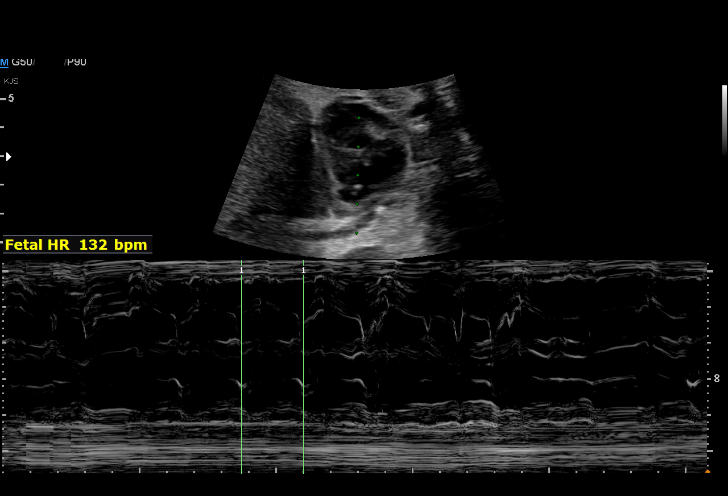
[im 5/40]
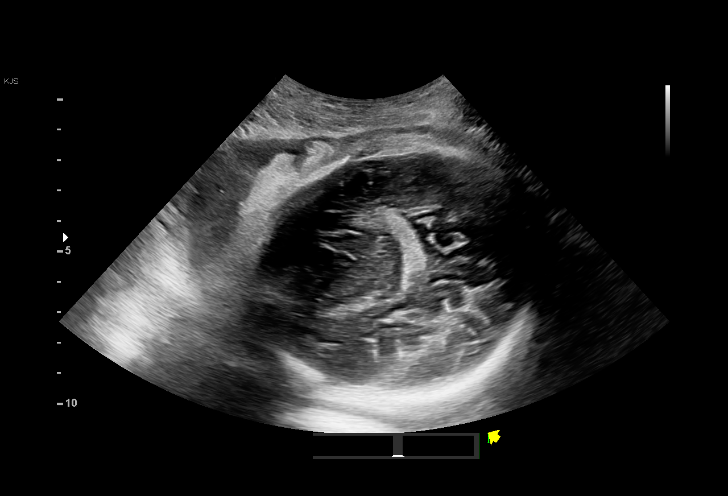
[im 8/40]
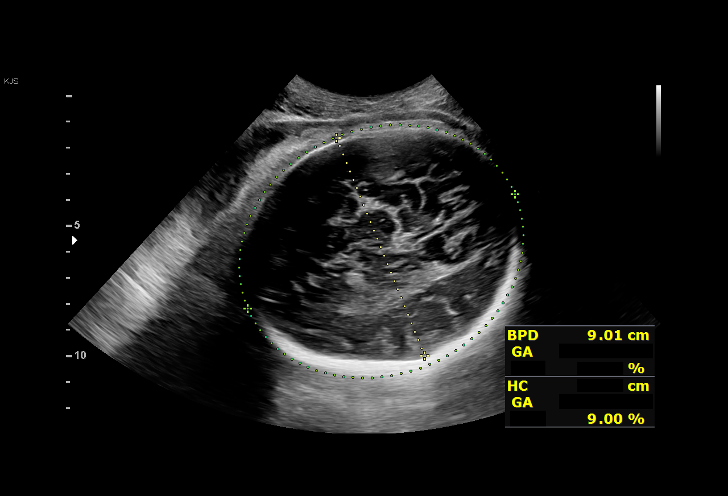
[im 12/40]
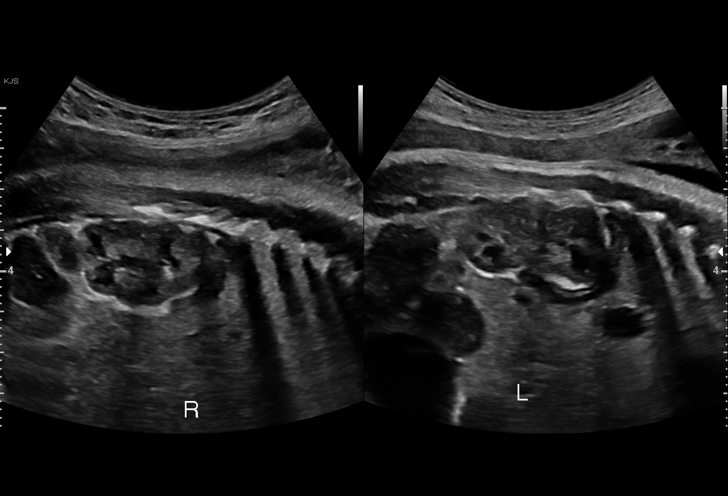
[im 15/40]
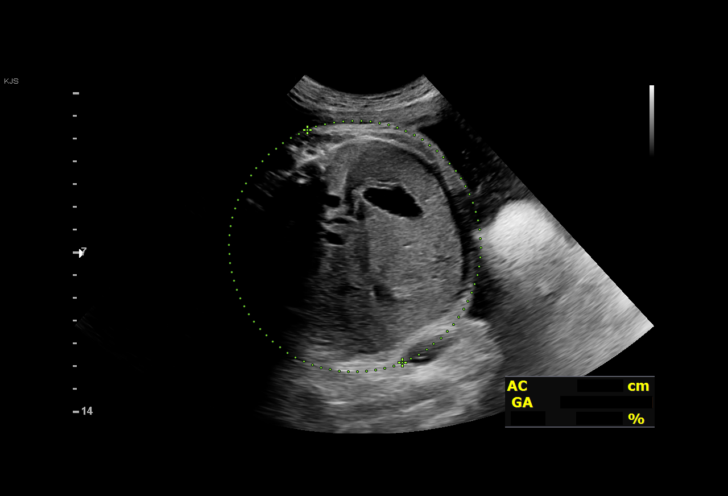
[im 18/40]
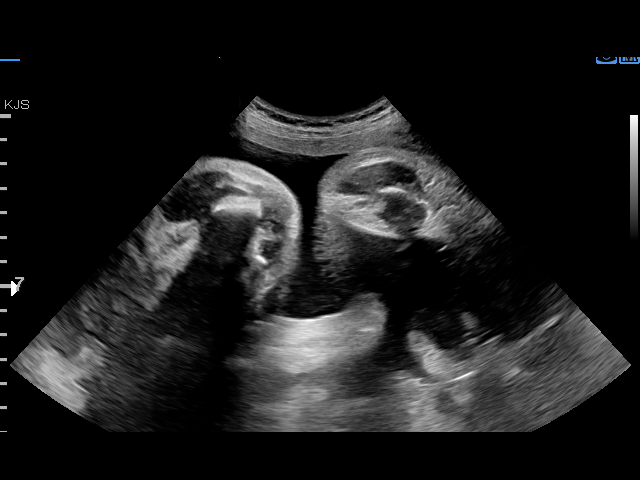
[im 22/40]
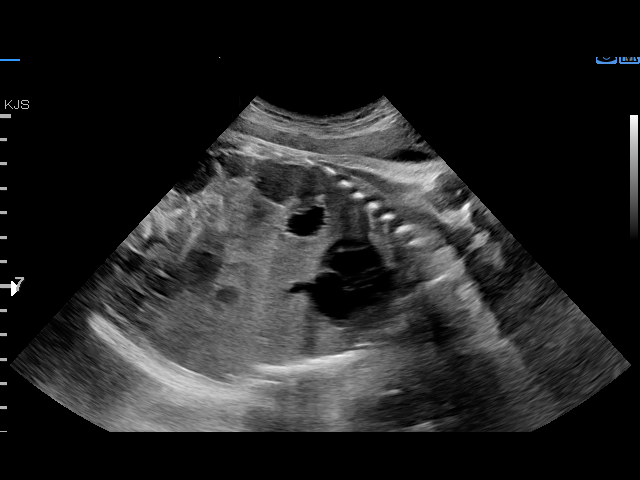
[im 25/40]
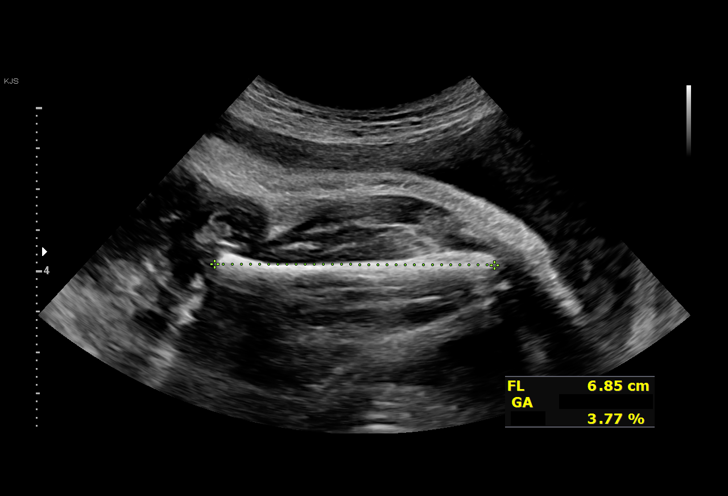
[im 28/40]
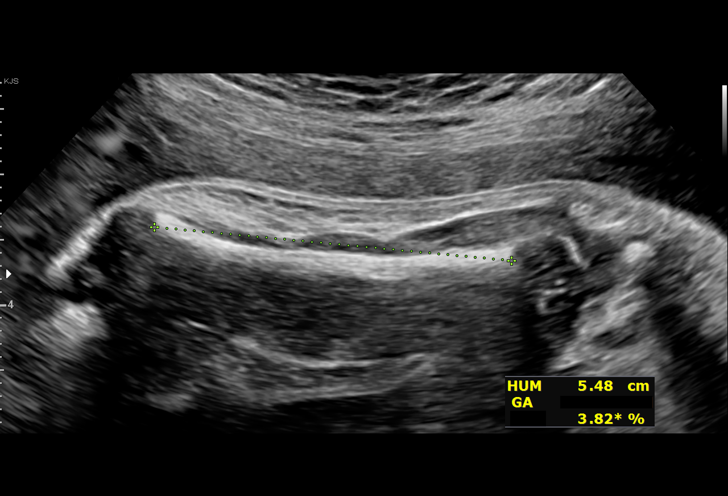
[im 32/40]
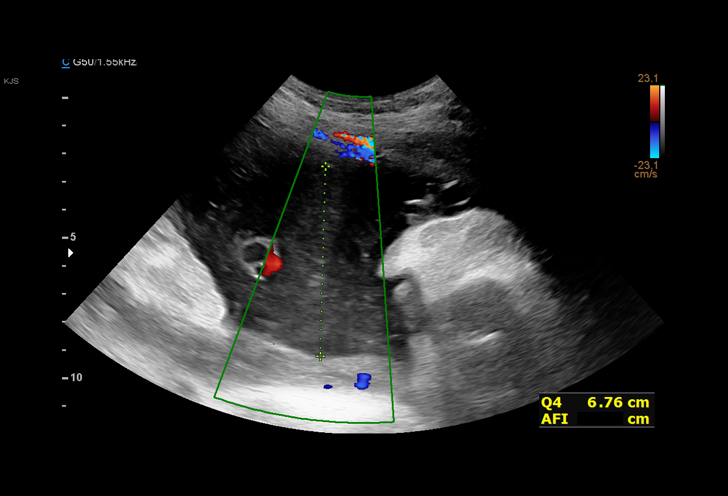
[im 35/40]
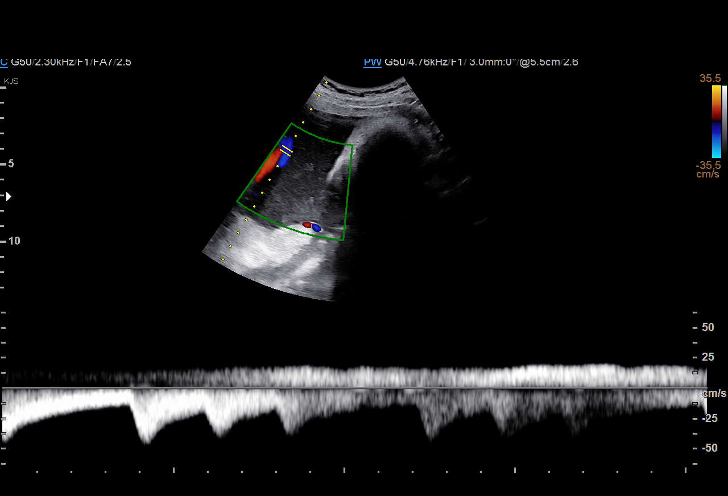
[im 38/40]
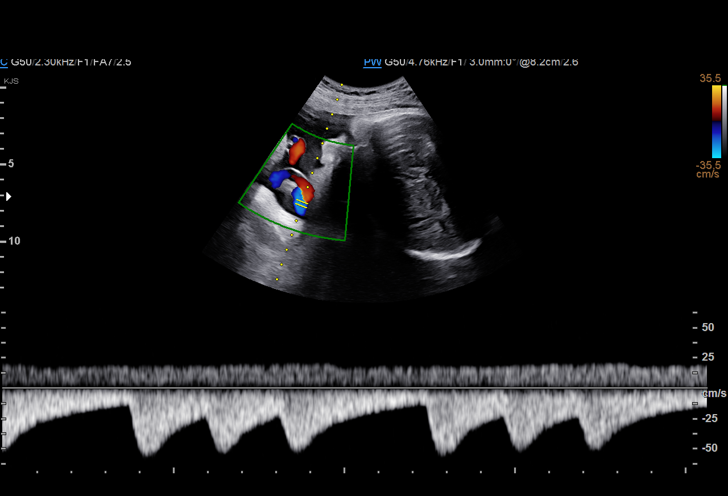

[12 of 28 positions shown; findings below may reference images not displayed]

Indications

 Fetal arrhythmia affecting pregnancy,
 antepartum
 Late to prenatal care, third trimester
 37 weeks gestation of pregnancy
 Genetic carrier (Increased carrier risk for
 SMA)
 Poor obstetrical history (hx of shoulder
 dystocia - 8928)
 Anemia during pregnancy in third trimester
 Low Risk NIPS
Fetal Evaluation

 Num Of Fetuses:         1
 Fetal Heart Rate(bpm):  132
 Cardiac Activity:       Arrhythmia noted
 Presentation:           Cephalic
 Placenta:               Posterior
 P. Cord Insertion:      Previously Visualized

 Amniotic Fluid
 AFI FV:      Within normal limits

 AFI Sum(cm)     %Tile       Largest Pocket(cm)
 22.7            90

 RUQ(cm)       RLQ(cm)       LUQ(cm)        LLQ(cm)

Biometry
 BPD:      90.2  mm     G. Age:  36w 4d         35  %    CI:        77.26   %    70 - 86
                                                         FL/HC:      20.7   %    20.9 -
 HC:      324.9  mm     G. Age:  36w 6d         10  %    HC/AC:      0.94        0.92 -
 AC:       346   mm     G. Age:  38w 4d         82  %    FL/BPD:     74.6   %    71 - 87
 FL:       67.3  mm     G. Age:  34w 4d        1.5  %    FL/AC:      19.5   %    20 - 24
 HUM:        56  mm     G. Age:  32w 4d        < 5  %

 Est. FW:    5857  gm    6 lb 15 oz      44  %
Gestational Age

 LMP:           31w 4d        Date:  01/04/21                 EDD:   10/11/21
 U/S Today:     36w 5d                                        EDD:   09/05/21
 Best:          37w 6d     Det. By:  U/S  (06/13/21)          EDD:   08/28/21
Anatomy

 Cranium:               Appears normal         LVOT:                   Previously seen
 Cavum:                 Previously seen        Aortic Arch:            Previously seen
 Ventricles:            Previously seen        Ductal Arch:            Previously seen
 Choroid Plexus:        Not well visualized    Diaphragm:              Appears normal
 Cerebellum:            Previously seen        Stomach:                Appears normal, left
                                                                       sided
 Posterior Fossa:       Previously seen        Abdomen:                Previously seen
 Nuchal Fold:           Not applicable (>20    Abdominal Wall:         Previously seen
                        wks GA)
 Face:                  Orbits and profile     Cord Vessels:           Previously seen
                        previously seen
 Lips:                  Previously seen        Kidneys:                Appear normal
 Palate:                Not well visualized    Bladder:                Appears normal
 Thoracic:              Previously seen        Spine:                  Previously seen
 Heart:                 Previously seen        Upper Extremities:      Previously seen
 RVOT:                  Previously seen        Lower Extremities:      Previously seen

 Other:  Fetus previously seen to be female. Nasal bone and Lenses, 3VV
         and 3VTV previously visualized.
Cervix Uterus Adnexa

 Cervix
 Not visualized (advanced GA >45wks)
Comments

 Rahmanda Marziah was seen for a follow up exam as she
 presented late for prenatal care and as a fetal arrhythmia was
 noted during her last exam.  She denies any problems since
 her last exam and reports feeling fetal movements throughout
 the day.
 The overall EFW measured today was 6 pounds 15 ounces
 (44th percentile).  There was normal amniotic fluid with a total
 AFI of 22.7 cm.
 On today's exam, a fetal arrhythmia continues to be noted.
 There is a skipped beat after every third beat.  The skipped
 beat occurs at regular intervals.  There were no signs of fetal
 hydrops noted today.
 The patient was advised that the skipped beat is most likely
 related to premature atrial contractions (PACs).  She was
 advised that PACs are usually benign and will generally
 resolve after birth.  As there were no signs of hydrops noted
 today, she was reassured that her baby most likely has not
 been affected by the arrhythmia.
 As the patient presented late for prenatal care, a cardiac
 defect has not been completely excluded.
 As the fetal arrhythmia occurs in a regular pattern and
 continues to be noted throughout today's exam, at her current
 gestational age delivery is recommended.
 She was advised that her baby will need to be evaluated after
 birth to determine if the arrhythmia is still present and if any
 treatment is necessary.
 The patient had a nonstress test following today's exam that
 showed possible subtle late decelerations.
 Due to this finding and due to the fetal arrhythmia, she was
 advised that my recommendation is for her to go immediately
 to the hospital for delivery.
 The patient declined to go immediately to the hospital as she
 stated that she had to take care of her daughter first.  She
 would prefer to go to the hospital tomorrow morning.
 After discussing her case with the first attending, the patient
 was notified that there are a large number of inductions
 already scheduled for tomorrow morning and her delivery
 may be delayed as a result of the large number scheduled
 induction's.
 The increased risk of an adverse pregnancy outcome such as
 a fetal demise by waiting for delivery was discussed.
 After everything was discussed with the patient, she agreed
 to go to the hospital for induction this evening at between 5 to
 6 PM.  Hopefully, she will present to the hospital at that time.
 The patient and her partner stated that all of her their
 questions have been answered today.
 They understand the risks that they are taking by not going to
 the hospital immediately following today's exam.
 A total of 30 minutes was spent counseling and coordinating
 the care for this patient.

## 2022-07-02 ENCOUNTER — Ambulatory Visit (HOSPITAL_COMMUNITY)
Admission: EM | Admit: 2022-07-02 | Discharge: 2022-07-02 | Discharge status: Against Medical Advice | Payer: Medicaid Other

## 2022-07-03 ENCOUNTER — Emergency Department (HOSPITAL_COMMUNITY)
Admission: EM | Admit: 2022-07-03 | Discharge: 2022-07-03 | Disposition: A | Discharge status: Home / Self Care | Payer: Medicaid Other | Attending: Emergency Medicine | Admitting: Emergency Medicine

## 2022-07-03 ENCOUNTER — Other Ambulatory Visit: Payer: Self-pay

## 2022-07-03 DIAGNOSIS — L03012 Cellulitis of left finger: Secondary | ICD-10-CM | POA: Insufficient documentation

## 2022-07-03 DIAGNOSIS — R2232 Localized swelling, mass and lump, left upper limb: Secondary | ICD-10-CM | POA: Diagnosis present

## 2022-07-03 DIAGNOSIS — Z87891 Personal history of nicotine dependence: Secondary | ICD-10-CM | POA: Diagnosis not present

## 2022-07-03 MED ORDER — LIDOCAINE HCL (PF) 1 % IJ SOLN
10.0000 mL | Freq: Once | INTRAMUSCULAR | Status: AC
  Administered 2022-07-03: 10 mL
  Filled 2022-07-03: qty 10

## 2022-07-03 MED ORDER — CEPHALEXIN 500 MG PO CAPS: 500.0000 mg | ORAL_CAPSULE | Freq: Three times a day (TID) | ORAL | 0 refills | Status: AC

## 2022-07-03 MED ORDER — SULFAMETHOXAZOLE-TRIMETHOPRIM 800-160 MG PO TABS: 1.0000 | ORAL_TABLET | Freq: Two times a day (BID) | ORAL | 0 refills | Status: DC

## 2022-07-03 NOTE — ED Provider Notes (Signed)
[facility] Emergency Department Provider Note MRN:  [id number]  Arrival date & time: 07/03/22     Chief Complaint   Paronychia   History of Present Illness   Catherine Miles is a 32 y.o. year-old female with no pertinent past medical history presenting to the ED with chief complaint of paronychia.  Painful left pointer finger for the past several days, trouble working due to the pain and swelling.  No fever.  Review of Systems  A thorough review of systems was obtained and all systems are negative except as noted in the HPI and PMH.   Patient's Health History    Past Medical History:  Diagnosis Date   Alcohol use disorder, moderate, dependence ([place]) 01/23/2015   Allergy    Cannabis use disorder, severe, dependence ([place]) 01/23/2015   Medical history non-contributory    Tobacco use disorder 01/23/2015    Past Surgical History:  Procedure Laterality Date   MANDIBLE SURGERY      Family History  Problem Relation Age of Onset   Cancer Maternal Aunt     Social History   Socioeconomic History   Marital status: Single    Spouse name: Not on file   Number of children: Not on file   Years of education: Not on file   Highest education level: Not on file  Occupational History   Not on file  Tobacco Use   Smoking status: Former    Packs/day: 0.50    Types: Cigarettes    Quit date: 04/03/2021    Years since quitting: 1.2   Smokeless tobacco: Never  Vaping Use   Vaping Use: Never used  Substance and Sexual Activity   Alcohol use: Not Currently    Alcohol/week: 12.0 standard drinks of alcohol    Types: 12 Shots of liquor per week    Comment: on weekends 6 or more drinks   Drug use: No   Sexual activity: Yes    Birth control/protection: None  Other Topics Concern   Not on file  Social History Narrative   Not on file   Social Determinants of Health   Financial Resource Strain: Not on file  Food Insecurity: No Food Insecurity (08/02/2021)    Hunger Vital Sign    Worried About Running Out of Food in the Last Year: Never true    Ran Out of Food in the Last Year: Never true  Transportation Needs: No Transportation Needs (08/02/2021)   PRAPARE - Hydrologist (Medical): No    Lack of Transportation (Non-Medical): No  Physical Activity: Not on file  Stress: Not on file  Social Connections: Not on file  Intimate Partner Violence: Not on file     Physical Exam   Vitals:   07/03/22 0409  BP: 117/74  Pulse: 91  Resp: 18  Temp: 98 F (36.7 C)  SpO2: 100%    CONSTITUTIONAL: Well-appearing, NAD NEURO/PSYCH:  Alert and oriented x 3, no focal deficits EYES:  eyes equal and reactive ENT/NECK:  no LAD, no JVD CARDIO: Regular rate, well-perfused, normal S1 and S2 PULM:  CTAB no wheezing or rhonchi GI/GU:  non-distended, non-tender MSK/SPINE:  No gross deformities, no edema SKIN: Discoloration and fluctuance to the medial aspect of the left distal pointer finger adjacent to the nailbed   *Additional and/or pertinent findings included in MDM below  Diagnostic and Interventional Summary    EKG Interpretation  Date/Time:    Ventricular Rate:    PR Interval:  QRS Duration:   QT Interval:    QTC Calculation:   R Axis:     Text Interpretation:         Labs Reviewed - No data to display  No orders to display    Medications  lidocaine (PF) (XYLOCAINE) 1 % injection 10 mL (10 mLs Infiltration Given by Other 07/03/22 2458)     Procedures  /  Critical Care .Marland KitchenIncision and Drainage  Date/Time: 07/03/2022 6:07 AM  Performed by: Maudie Flakes, MD Authorized by: Maudie Flakes, MD   Consent:    Consent obtained:  Verbal   Consent given by:  Patient   Risks, benefits, and alternatives were discussed: yes     Risks discussed:  Bleeding, damage to other organs, infection, incomplete drainage and pain Universal protocol:    Procedure explained and questions answered to patient or proxy's  satisfaction: yes     Immediately prior to procedure, a time out was called: yes     Patient identity confirmed:  Verbally with patient Location:    Type:  Abscess (Paronychia)   Size:  1cm   Location: Left pointer finger. Pre-procedure details:    Skin preparation:  Chlorhexidine with alcohol Sedation:    Sedation type:  None Anesthesia:    Anesthesia method:  Nerve block   Block location:  Digital nerve block   Block needle gauge:  25 G   Block anesthetic:  Lidocaine 1% w/o epi   Block injection procedure:  Anatomic landmarks identified and anatomic landmarks palpated   Block outcome:  Anesthesia achieved Procedure type:    Complexity:  Complex Procedure details:    Ultrasound guidance: no     Incision types:  Single straight   Incision depth:  Subcutaneous   Wound management:  Probed and deloculated, irrigated with saline, extensive cleaning and debrided   Drainage:  Purulent   Drainage amount:  Copious   Wound treatment:  Wound left open   Packing materials:  None Post-procedure details:    Procedure completion:  Tolerated well, no immediate complications   ED Course and Medical Decision Making  Initial Impression and Ddx Exam is consistent with paronychia.  No systemic symptoms, neurovascular intact, no other complicating features.  Drained as described above.  Past medical/surgical history that increases complexity of ED encounter: None  Interpretation of Diagnostics Laboratory and/or imaging options to aid in the diagnosis/care of the patient were considered.  After careful history and physical examination, it was determined that there was no indication for diagnostics at this time.  Patient Reassessment and Ultimate Disposition/Management     Discharge  Patient management required discussion with the following services or consulting groups:  None  Complexity of Problems Addressed Acute complicated illness or Injury  Additional Data Reviewed and  Analyzed Further history obtained from: None  Additional Factors Impacting ED Encounter Risk Prescriptions and Minor Procedures  Barth Kirks. [place]onia Small, [place] [email or web address]  Final Clinical Impressions(s) / ED Diagnoses     ICD-10-CM   1. Paronychia of finger of left hand  L03.012       ED Discharge Orders          Ordered    sulfamethoxazole-trimethoprim (BACTRIM DS) 800-160 MG tablet  2 times daily,   Status:  Discontinued        07/03/22 0605    cephALEXin (KEFLEX) 500 MG capsule  3 times daily        07/03/22 0605  Discharge Instructions Discussed with and Provided to Patient:    Discharge Instructions      You were evaluated in the Emergency Department and after careful evaluation, we did not find any emergent condition requiring admission or further testing in the hospital.  Your exam/testing today is overall reassuring.  We drained your finger infection here in the emergency department.  Take the Keflex antibiotic as directed for the next few days to make sure the finger fully heals.  The incision site may drain blood or pus over the next day or 2.  This is normal.  Please return to the Emergency Department if you experience any worsening of your condition.   Thank you for allowing [place] to be a part of your care.      Maudie Flakes, MD 07/03/22 [phone]

## 2022-07-03 NOTE — Discharge Instructions (Signed)
You were evaluated in the Emergency Department and after careful evaluation, we did not find any emergent condition requiring admission or further testing in the hospital.  Your exam/testing today is overall reassuring.  We drained your finger infection here in the emergency department.  Take the Keflex antibiotic as directed for the next few days to make sure the finger fully heals.  The incision site may drain blood or pus over the next day or 2.  This is normal.  Please return to the Emergency Department if you experience any worsening of your condition.   Thank you for allowing [place] to be a part of your care.

## 2022-07-03 NOTE — ED Triage Notes (Signed)
Patient reports left distal index finger infection with swelling and drainage onset last week .
